# Patient Record
Sex: Male | Born: 1965 | Race: White | Hispanic: No | Marital: Married | Smoking: Never smoker
Health system: Southern US, Community
[De-identification: ages and names within clinical notes are randomized; demographics above are authoritative.]

## PROBLEM LIST (undated history)

## (undated) DIAGNOSIS — J45909 Unspecified asthma, uncomplicated: Secondary | ICD-10-CM

## (undated) HISTORY — DX: Unspecified asthma, uncomplicated: J45.909

## (undated) HISTORY — PX: TONSILLECTOMY: SUR1361

---

## 2001-12-31 ENCOUNTER — Encounter: Payer: Self-pay | Admitting: Family Medicine

## 2001-12-31 ENCOUNTER — Encounter: Admission: RE | Admit: 2001-12-31 | Discharge: 2001-12-31 | Payer: Self-pay | Admitting: Family Medicine

## 2003-12-16 ENCOUNTER — Encounter: Admission: RE | Admit: 2003-12-16 | Discharge: 2003-12-16 | Payer: Self-pay | Admitting: Family Medicine

## 2003-12-29 ENCOUNTER — Encounter: Admission: RE | Admit: 2003-12-29 | Discharge: 2003-12-29 | Payer: Self-pay | Admitting: Family Medicine

## 2005-10-07 IMAGING — CR DG CHEST 2V
2 series · 2 of 2 positions shown · non-contrast
Comparison: none

CLINICAL DATA: Right chest pain and heart palpitations. 
 TWO VIEW CHEST: 
 Two view chest with no priors for comparison. 
 Level of inspiration is suboptimal.  Heart and lungs within normal limits considering.  Osseous structures intact with a mild upper thoracic/lower cervical scoliosis to the left.

[view not recorded (1 of 2)]
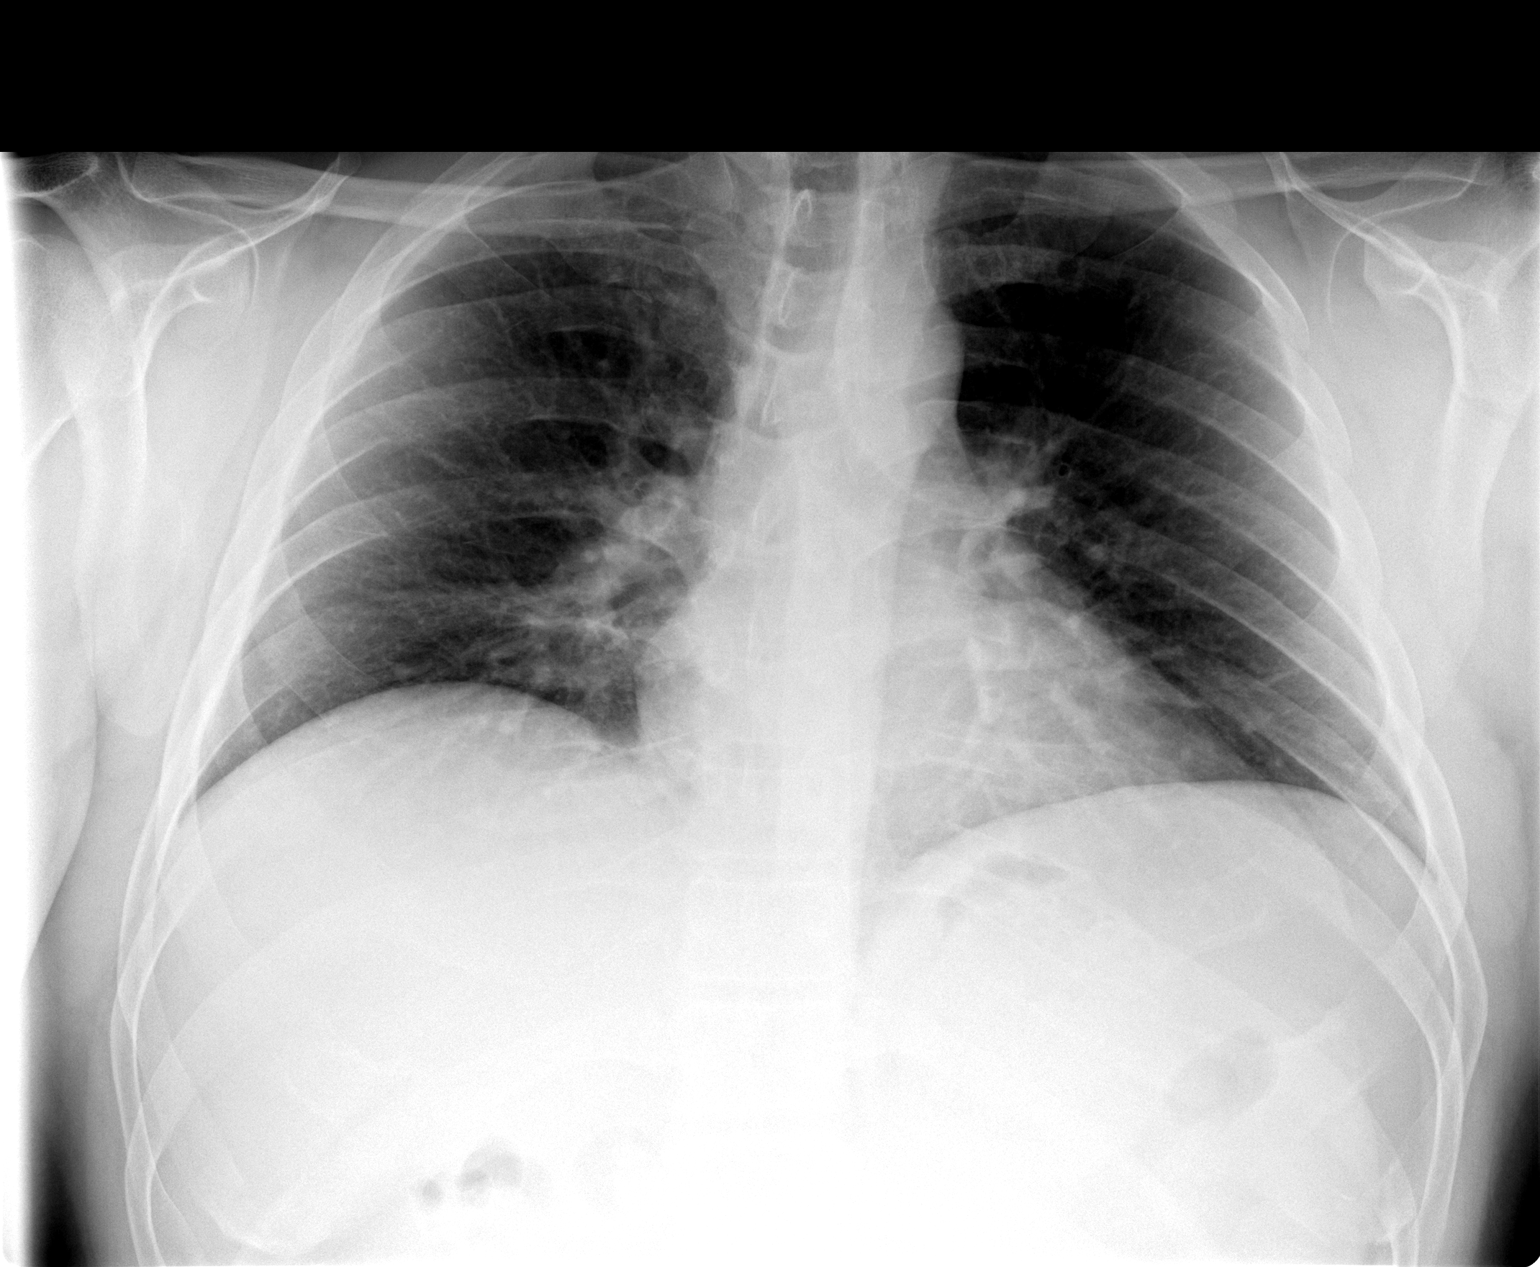

[view not recorded (2 of 2)]
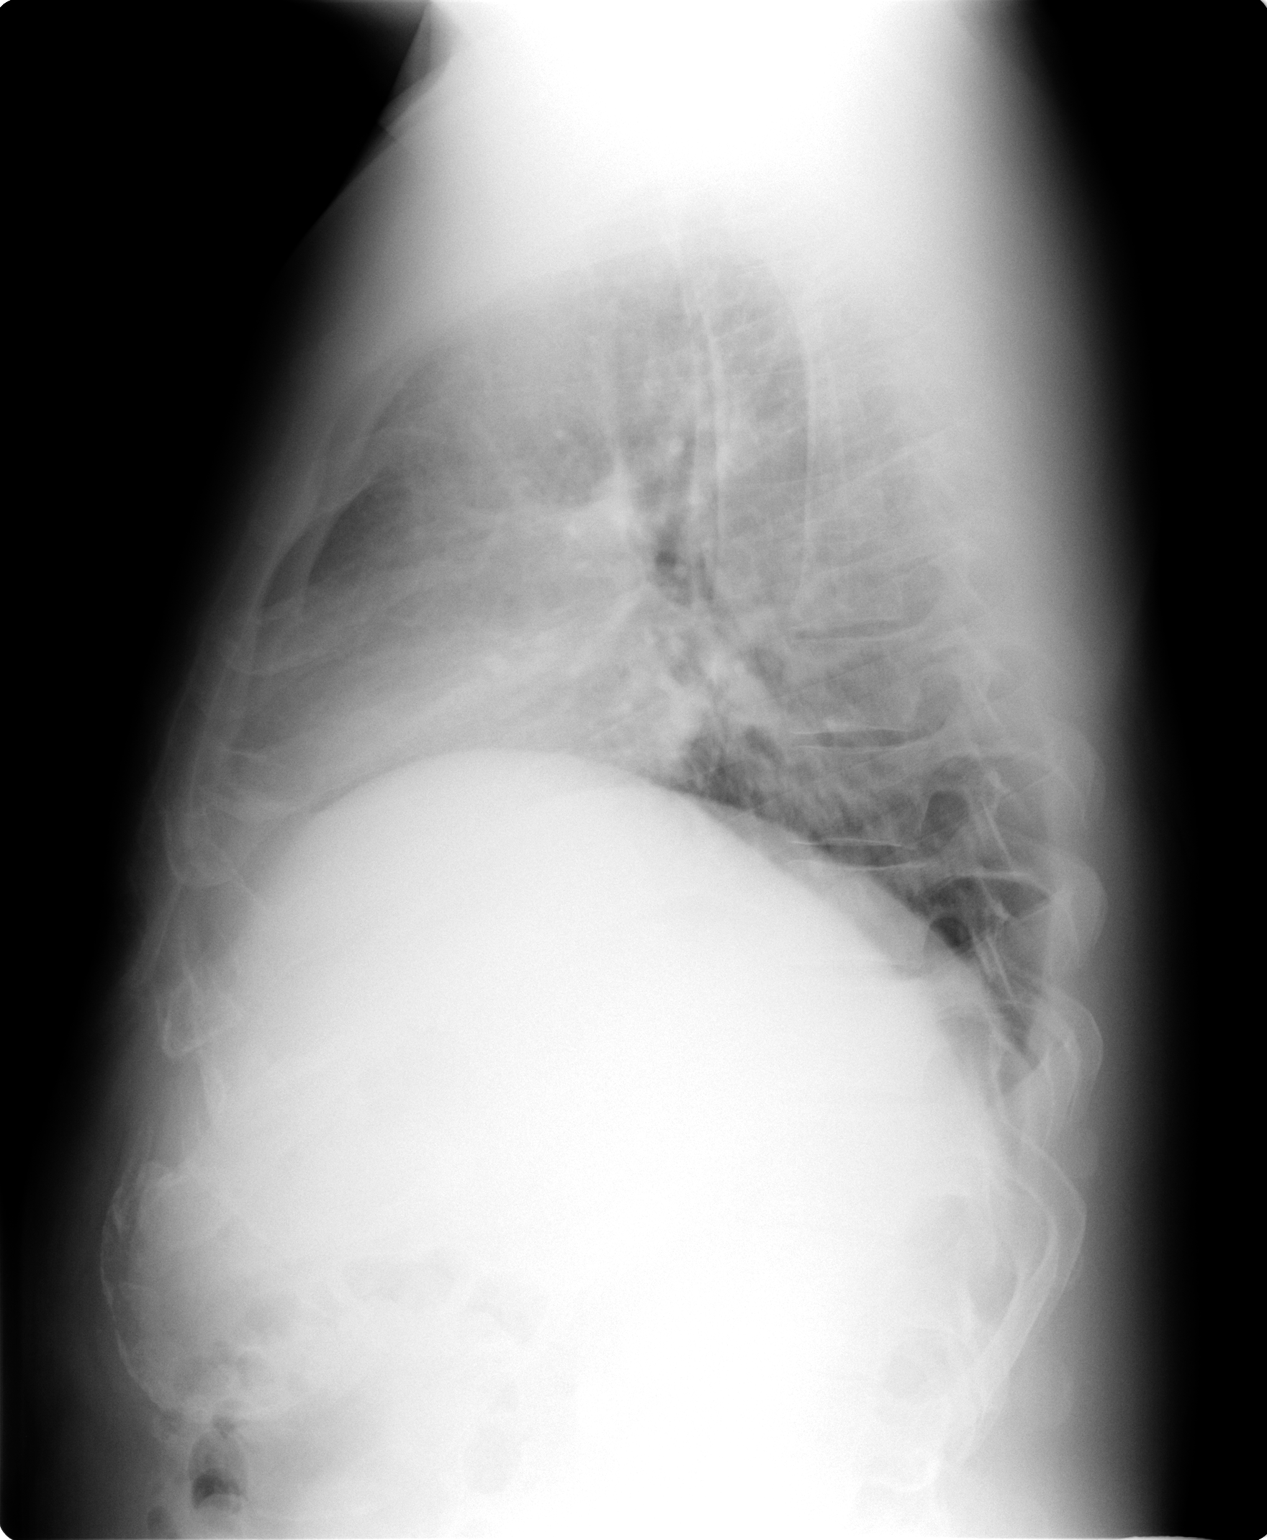

[2 of 2 positions shown; findings below may reference images not displayed]

IMPRESSION: Suboptimal level of inspiration ? no active disease.

## 2005-10-20 IMAGING — US US ABDOMEN COMPLETE
1 series · 14 of 25 positions shown · non-contrast
Comparison: none

CLINICAL DATA: Elevated liver function tests.  
 ABDOMINAL ULTRASOUND COMPLETE
 There is no evidence of gallstones or gallbladder wall thickening.  There is no evidence of biliary ductal dilatation with the common bile duct measuring approximately 3 mm.  The liver has diffusely increased parenchymal echogenicity, consistent  with diffuse fatty infiltration.  No focal liver lesions are identified.  
 The visualized portion of the IVC and pancreas are unremarkable in appearance.  The spleen is within normal limits in size measuring approximately 11 to 12 cm in length.  Both kidneys are normal in size and appearance and there is no evidence of hydronephrosis.  There is no evidence of abdominal aortic aneurysm and no ascites is visualized. 
 IMPRESSION
 1.  Diffuse fatty infiltration of the liver.  
 2.  No evidence of gallstones or biliary dilatation.

[Series 1: unknown · 14 of 62 slices shown]
[im 1/62]
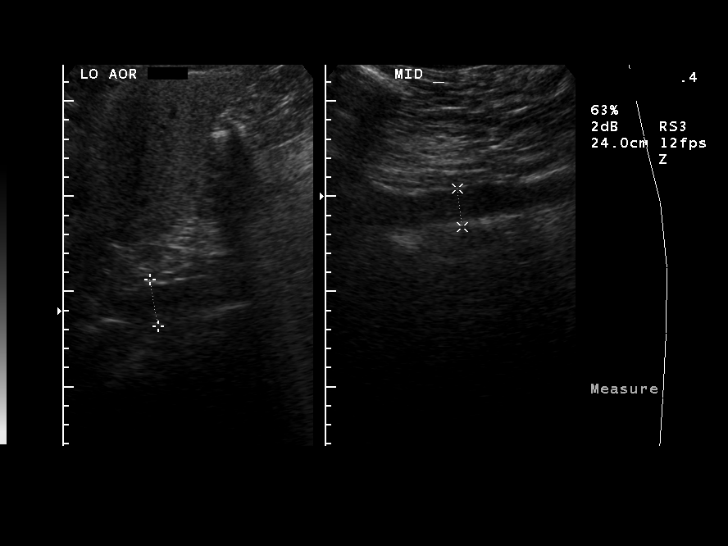
[im 6/62]
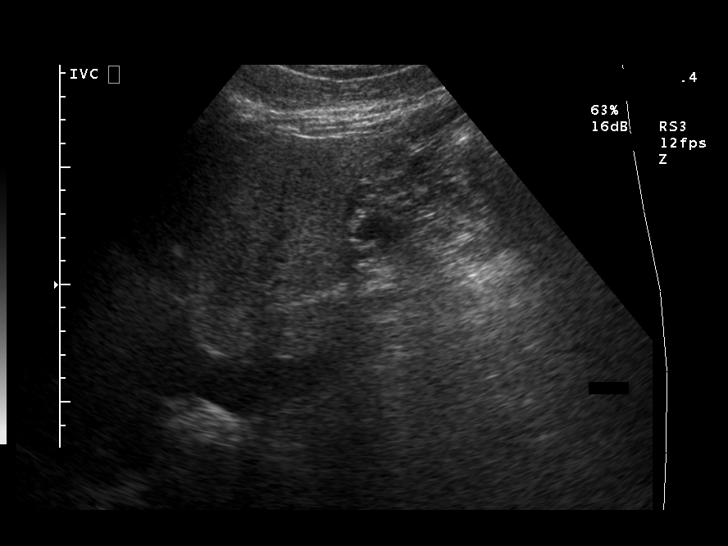
[im 11/62]
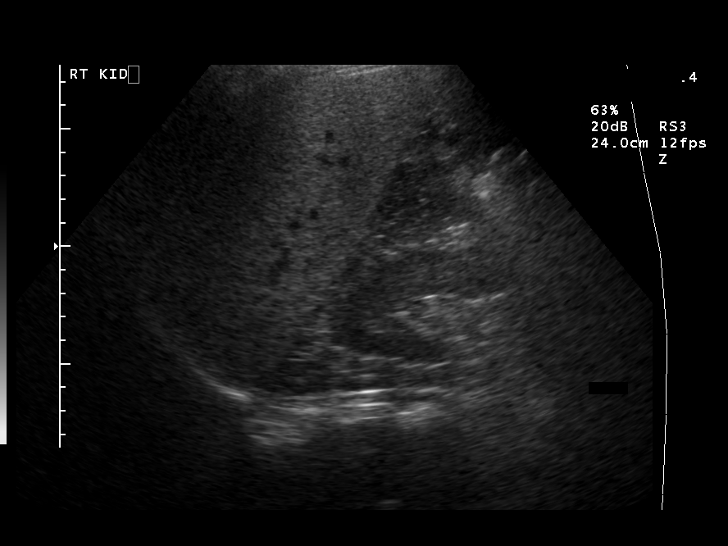
[im 16/62]
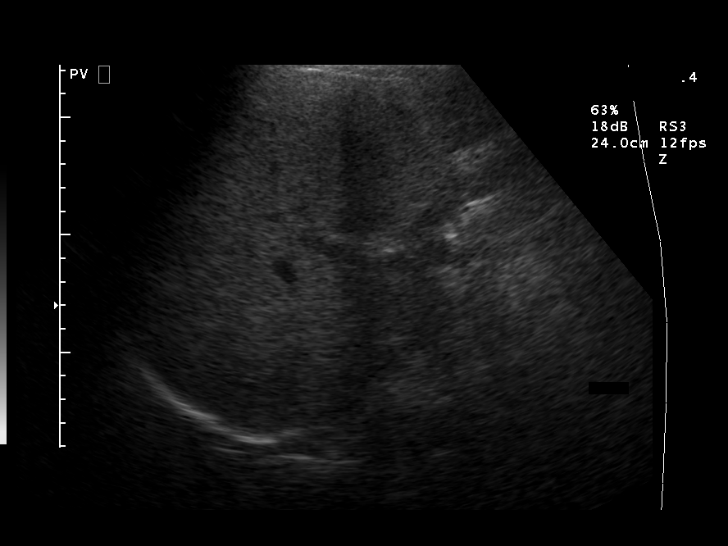
[im 21/62]
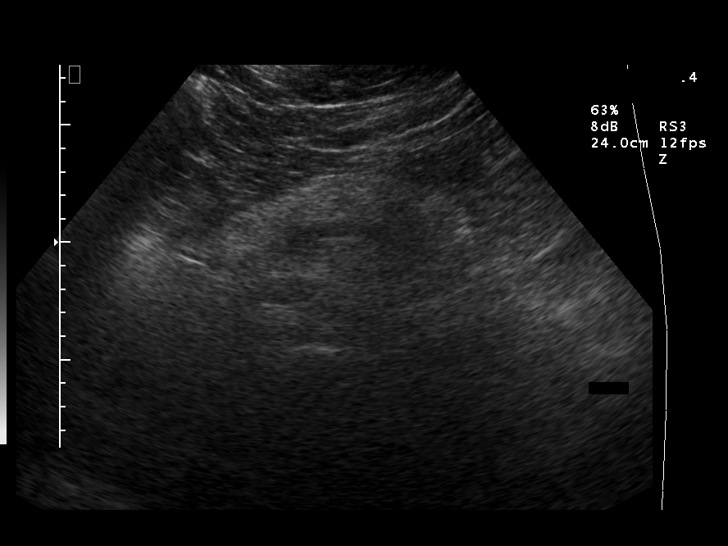
[im 23/62]
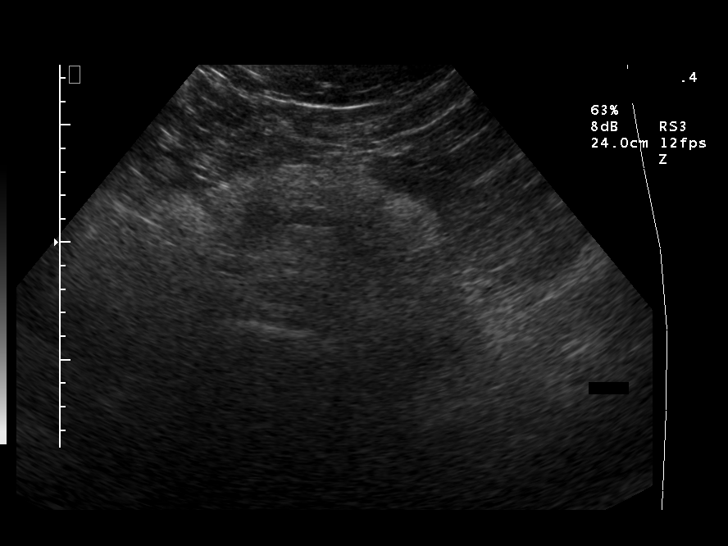
[im 28/62]
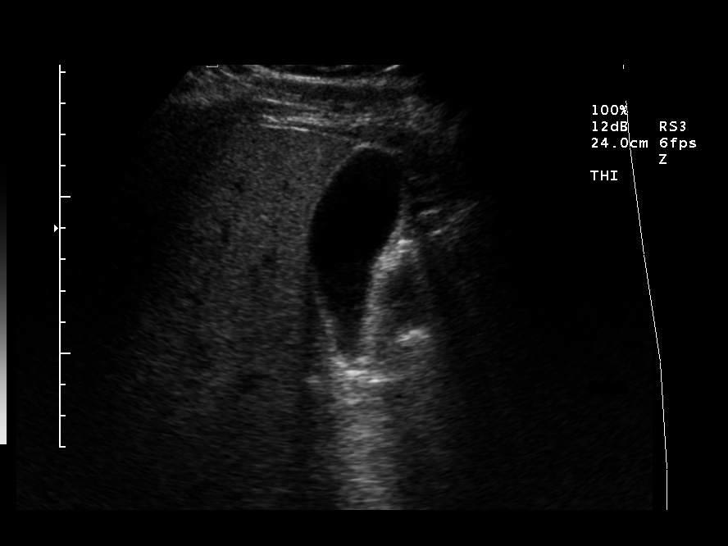
[im 34/62]
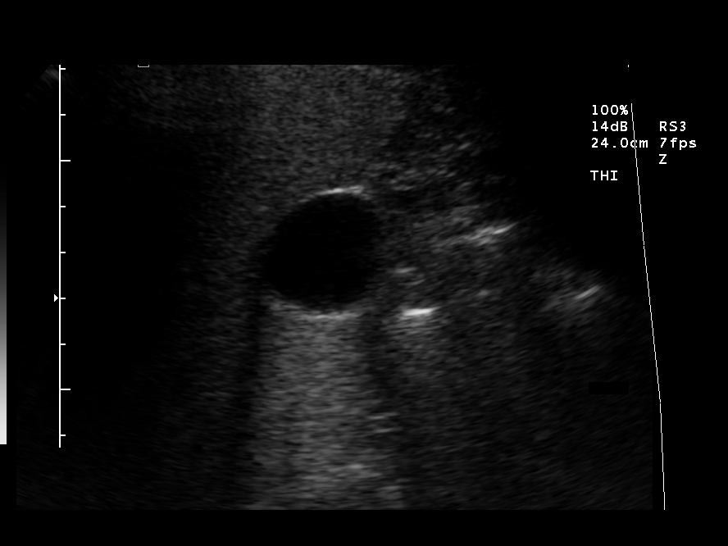
[im 39/62]
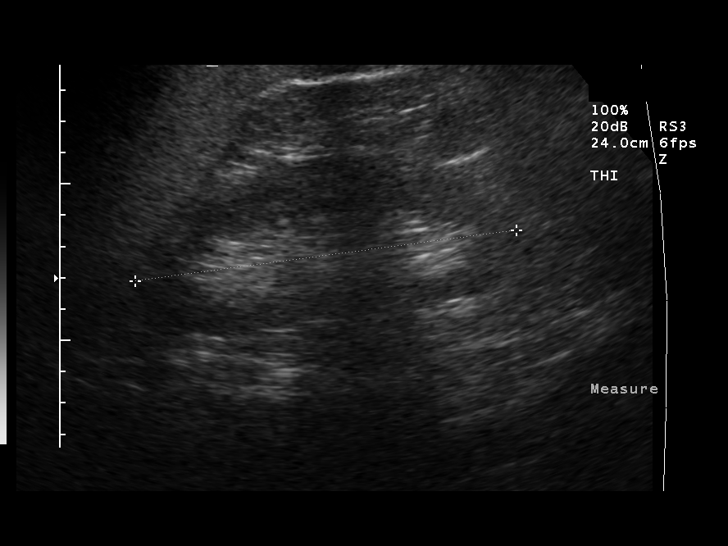
[im 41/62]
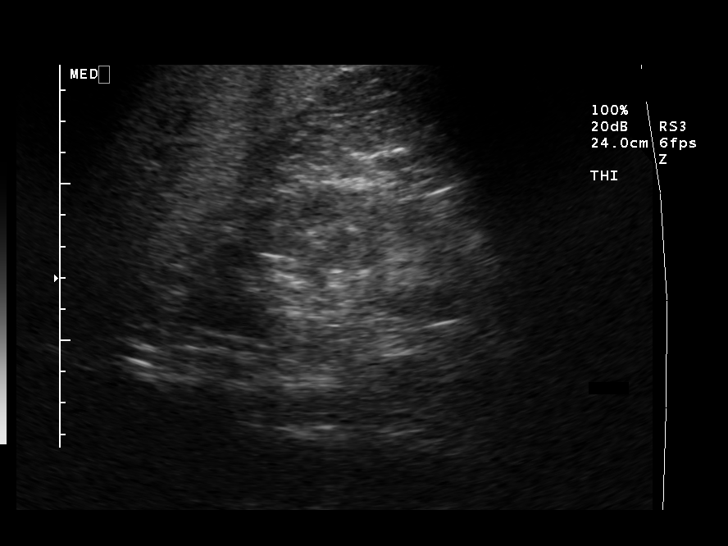
[im 46/62]
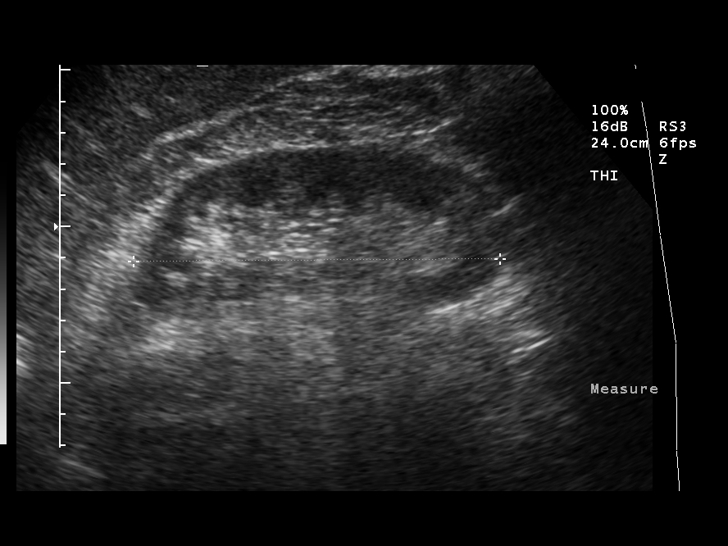
[im 51/62]
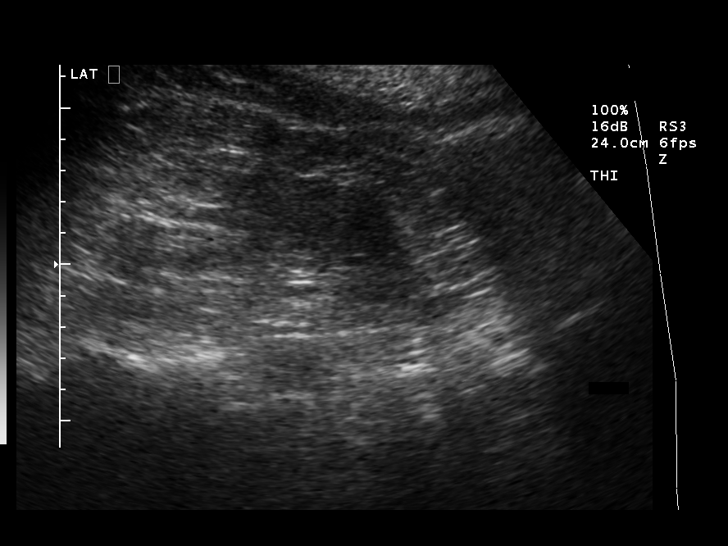
[im 56/62]
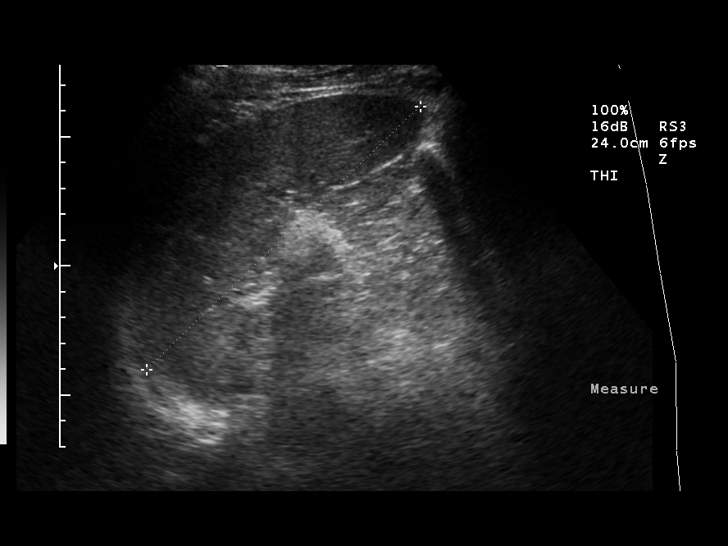
[im 62/62]
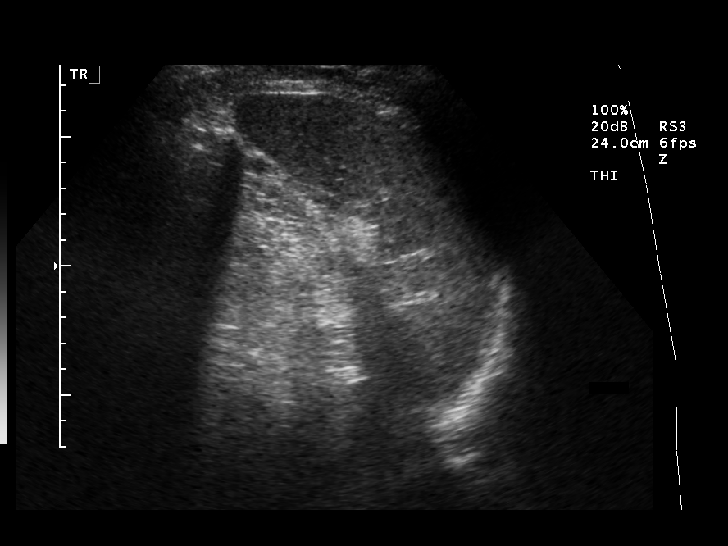

[14 of 25 positions shown; findings below may reference images not displayed]

## 2012-03-01 ENCOUNTER — Emergency Department (HOSPITAL_COMMUNITY)
Admission: EM | Admit: 2012-03-01 | Discharge: 2012-03-01 | Discharge status: Against Medical Advice | Payer: BC Managed Care – PPO | Source: Home / Self Care

## 2012-03-01 NOTE — ED Notes (Signed)
Call pt x 3 in lobby no answer. Registration did not know where he went

## 2019-07-18 ENCOUNTER — Ambulatory Visit: Payer: Self-pay | Attending: Internal Medicine

## 2019-07-18 DIAGNOSIS — Z23 Encounter for immunization: Secondary | ICD-10-CM | POA: Insufficient documentation

## 2019-07-18 NOTE — Progress Notes (Signed)
   Covid-19 Vaccination Clinic  Name:  Eugene Gould    MRN: [id number] DOB: [birth year]  07/18/2019  [name] was observed post Covid-19 immunization for 15 minutes without incident. He was provided with Vaccine Information Sheet and instruction to access the V-Safe system.   [name] was instructed to call 911 with any severe reactions post vaccine: Marland Kitchen Difficulty breathing  . Swelling of face and throat  . A fast heartbeat  . A bad rash all over body  . Dizziness and weakness   Immunizations Administered    Name Date Dose VIS Date Route   Pfizer COVID-19 Vaccine 07/18/2019  9:49 AM 0.3 mL 04/26/2019 Intramuscular   Manufacturer: [place]   Lot: [id number]   [place]: [id number]

## 2019-08-13 ENCOUNTER — Ambulatory Visit: Payer: Self-pay | Attending: Internal Medicine

## 2019-08-13 DIAGNOSIS — Z23 Encounter for immunization: Secondary | ICD-10-CM

## 2019-08-13 NOTE — Progress Notes (Signed)
   Covid-19 Vaccination Clinic  Name:  Eugene Gould    MRN: [id number] DOB: 06/08/1965  08/13/2019  [name] was observed post Covid-19 immunization for 15 minutes without incident. He was provided with Vaccine Information Sheet and instruction to access the V-Safe system.   [name] was instructed to call 911 with any severe reactions post vaccine: Marland Kitchen Difficulty breathing  . Swelling of face and throat  . A fast heartbeat  . A bad rash all over body  . Dizziness and weakness   Immunizations Administered    Name Date Dose VIS Date Route   Pfizer COVID-19 Vaccine 08/13/2019  3:54 PM 0.3 mL 04/26/2019 Intramuscular   Manufacturer: [place]   Lot: [id number]   [place]: [id number]

## 2020-02-19 ENCOUNTER — Other Ambulatory Visit: Payer: Self-pay

## 2020-02-19 DIAGNOSIS — Z20822 Contact with and (suspected) exposure to covid-19: Secondary | ICD-10-CM

## 2020-02-20 LAB — SARS-COV-2, NAA 2 DAY TAT

## 2020-02-20 LAB — NOVEL CORONAVIRUS, NAA: SARS-CoV-2, NAA: NOT DETECTED

## 2020-07-29 ENCOUNTER — Encounter (HOSPITAL_BASED_OUTPATIENT_CLINIC_OR_DEPARTMENT_OTHER): Payer: Self-pay | Admitting: Family Medicine

## 2020-07-29 ENCOUNTER — Ambulatory Visit (INDEPENDENT_AMBULATORY_CARE_PROVIDER_SITE_OTHER): Payer: Managed Care, Other (non HMO) | Admitting: Family Medicine

## 2020-07-29 ENCOUNTER — Other Ambulatory Visit: Payer: Self-pay

## 2020-07-29 ENCOUNTER — Other Ambulatory Visit (HOSPITAL_BASED_OUTPATIENT_CLINIC_OR_DEPARTMENT_OTHER): Payer: Self-pay

## 2020-07-29 ENCOUNTER — Other Ambulatory Visit (HOSPITAL_BASED_OUTPATIENT_CLINIC_OR_DEPARTMENT_OTHER)
Admission: RE | Admit: 2020-07-29 | Discharge: 2020-07-29 | Disposition: A | Discharge status: Home / Self Care | Payer: Managed Care, Other (non HMO) | Source: Ambulatory Visit | Attending: Nurse Practitioner | Admitting: Nurse Practitioner

## 2020-07-29 VITALS — BP 152/92 | HR 62 | Ht 72.0 in | Wt 261.0 lb

## 2020-07-29 DIAGNOSIS — R030 Elevated blood-pressure reading, without diagnosis of hypertension: Secondary | ICD-10-CM | POA: Insufficient documentation

## 2020-07-29 DIAGNOSIS — Z0000 Encounter for general adult medical examination without abnormal findings: Secondary | ICD-10-CM | POA: Insufficient documentation

## 2020-07-29 DIAGNOSIS — Z0289 Encounter for other administrative examinations: Secondary | ICD-10-CM | POA: Insufficient documentation

## 2020-07-29 DIAGNOSIS — Z6835 Body mass index (BMI) 35.0-35.9, adult: Secondary | ICD-10-CM | POA: Diagnosis not present

## 2020-07-29 DIAGNOSIS — Z1211 Encounter for screening for malignant neoplasm of colon: Secondary | ICD-10-CM

## 2020-07-29 LAB — BASIC METABOLIC PANEL
Anion gap: 7 (ref 5–15)
BUN: 13 mg/dL (ref 6–20)
CO2: 27 mmol/L (ref 22–32)
Calcium: 8.9 mg/dL (ref 8.9–10.3)
Chloride: 104 mmol/L (ref 98–111)
Creatinine, Ser: 0.97 mg/dL (ref 0.61–1.24)
GFR, Estimated: >60 mL/min (ref >60)
Glucose, Bld: 90 mg/dL (ref 70–99)
Potassium: 4.5 mmol/L (ref 3.5–5.1)
Sodium: 138 mmol/L (ref 135–145)

## 2020-07-29 LAB — CBC WITH DIFFERENTIAL/PLATELET
Abs Immature Granulocytes: 0.01 10*3/uL (ref 0.00–0.07)
Basophils Absolute: 0 10*3/uL (ref 0.0–0.1)
Basophils Relative: 1 %
Eosinophils Absolute: 0.1 10*3/uL (ref 0.0–0.5)
Eosinophils Relative: 1 %
HCT: 44.3 % (ref 39.0–52.0)
Hemoglobin: 15.1 g/dL (ref 13.0–17.0)
Immature Granulocytes: 0 %
Lymphocytes Relative: 36 %
Lymphs Abs: 2.7 10*3/uL (ref 0.7–4.0)
MCH: 29.8 pg (ref 26.0–34.0)
MCHC: 34.1 g/dL (ref 30.0–36.0)
MCV: 87.4 fL (ref 80.0–100.0)
Monocytes Absolute: 0.5 10*3/uL (ref 0.1–1.0)
Monocytes Relative: 6 %
Neutro Abs: 4.2 10*3/uL (ref 1.7–7.7)
Neutrophils Relative %: 56 %
Platelets: 284 10*3/uL (ref 150–400)
RBC: 5.07 MIL/uL (ref 4.22–5.81)
RDW: 13 % (ref 11.5–15.5)
WBC: 7.4 10*3/uL (ref 4.0–10.5)
nRBC: 0 % (ref 0.0–0.2)

## 2020-07-29 LAB — LIPID PANEL
Cholesterol: 226 mg/dL — ABNORMAL HIGH (ref 0–200)
HDL: 39 mg/dL — ABNORMAL LOW (ref >40)
LDL Cholesterol: 169 mg/dL — ABNORMAL HIGH (ref 0–99)
Total CHOL/HDL Ratio: 5.8 RATIO
Triglycerides: 89 mg/dL (ref <150)
VLDL: 18 mg/dL (ref 0–40)

## 2020-07-29 NOTE — Progress Notes (Signed)
New Patient Office Visit  Subjective:  Patient ID: Eugene Gould, male    DOB: May 31, 1965  Age: 55 y.o. MRN: [id number]  CC:  Chief Complaint  Patient presents with  . Establish Care  . Employment Physical    HPI Eugene Gould is a 55 year old male presenting to establish in clinic and to complete physical exam for work.  Denies any significant past medical history.  Does have notable family history, particular related to hypertension, cardiac disease.  History reviewed. No pertinent past medical history.  History reviewed. No pertinent surgical history.  Family History  Problem Relation Age of Onset  . Anuerysm Mother   . Heart disease Father   . Hypertension Father   . Gout Brother   . Alzheimer's disease Maternal Grandmother   . Cancer Paternal Grandmother   . COPD Paternal Grandfather     Social History   Socioeconomic History  . Marital status: Married    Spouse name: Not on file  . Number of children: Not on file  . Years of education: Not on file  . Highest education level: Not on file  Occupational History    Employer: [employer]  Tobacco Use  . Smoking status: Never Smoker  . Smokeless tobacco: Never Used  Vaping Use  . Vaping Use: Never used  Substance and Sexual Activity  . Alcohol use: Not Currently  . Drug use: Never  . Sexual activity: Yes  Other Topics Concern  . Not on file  Social History Narrative  . Not on file   Social Determinants of Health   Financial Resource Strain: Not on file  Food Insecurity: Not on file  Transportation Needs: Not on file  Physical Activity: Not on file  Stress: Not on file  Social Connections: Not on file  Intimate Partner Violence: Not on file    ROS Review of Systems  Constitutional: Negative for chills, fatigue and fever.  HENT: Negative for dental problem, ear pain and sore throat.   Eyes: Negative for photophobia and visual disturbance.  Respiratory: Negative for cough and shortness of breath.    Cardiovascular: Negative for chest pain and palpitations.  Gastrointestinal: Negative for abdominal pain, constipation, diarrhea, nausea and vomiting.  Endocrine: Negative for polydipsia and polyuria.  Genitourinary: Negative for dysuria and urgency.  Musculoskeletal: Negative for arthralgias and neck pain.  Skin: Negative for color change and rash.  Neurological: Negative for dizziness, weakness and light-headedness.  Hematological: Does not bruise/bleed easily.  Psychiatric/Behavioral: Negative for decreased concentration and sleep disturbance. The patient is not nervous/anxious.     Objective:   Today's Vitals: BP (!) 152/92   Pulse 62   Ht 6' (1.829 m)   Wt 261 lb (118.4 kg)   SpO2 99%   BMI 35.40 kg/m   Physical Exam Constitutional:      General: He is not in acute distress.    Appearance: He is obese.  HENT:     Head: Normocephalic and atraumatic.     Right Ear: Tympanic membrane, ear canal and external ear normal.     Left Ear: Tympanic membrane, ear canal and external ear normal.     Nose: Nose normal.     Mouth/Throat:     Mouth: Mucous membranes are moist.     Pharynx: Oropharynx is clear.  Eyes:     Extraocular Movements: Extraocular movements intact.     Conjunctiva/sclera: Conjunctivae normal.     Pupils: Pupils are equal, round, and reactive to light.  Cardiovascular:     Rate and Rhythm: Normal rate and regular rhythm.     Pulses: Normal pulses.     Heart sounds: Normal heart sounds. No murmur heard.   Pulmonary:     Effort: Pulmonary effort is normal.     Breath sounds: Normal breath sounds. No wheezing.  Abdominal:     General: Bowel sounds are normal. There is no distension.     Palpations: Abdomen is soft.     Tenderness: There is no abdominal tenderness. There is no guarding.  Musculoskeletal:     Cervical back: Normal range of motion. No tenderness.  Skin:    General: Skin is warm.     Capillary Refill: Capillary refill takes less than 2  seconds.     Coloration: Skin is not jaundiced.     Findings: No rash.  Neurological:     General: No focal deficit present.     Mental Status: He is alert and oriented to person, place, and time.     Gait: Gait normal.  Psychiatric:        Mood and Affect: Mood normal.        Behavior: Behavior normal.     Assessment & Plan:   Problem List Items Addressed This Visit      Other   Elevated BP without diagnosis of hypertension    Recommend interval blood pressure checks at home with home blood pressure cuff Provided with handout to log blood pressure checks Discussed lifestyle modifications as above      Relevant Orders   Basic Metabolic Panel (BMET)   CBC with Differential/Platelet   Lipid panel   Physical exam    CPE completed today Discussed alteration of blood pressure today in the office as well as BMI. Will check labs including CBC, BMP, lipid panel We will place referral to gastroenterology for screening colonoscopy Discussed need for tetanus booster Recommend intermittent blood pressure checks at home as patient reports he has an automatic cuff Given significant family history of hypertension and cardiac disease, recommend close monitoring of blood pressure and working towards reduction of BMI to normal range Discussed lifestyle modification, benefits of exercise, specifically recommendation of 150 minutes of moderate intensity per week.  Reviewed gradual regimen of increasing weekly exercise      Relevant Orders   Basic Metabolic Panel (BMET)   CBC with Differential/Platelet   Lipid panel   BMI 35.0-35.9,adult    Discussed lifestyle modifications above We will continue to monitor at future visits Labs to be completed as above      Relevant Orders   Basic Metabolic Panel (BMET)   CBC with Differential/Platelet   Lipid panel    Other Visit Diagnoses    Encounter for medical examination to establish care    -  Primary   Encounter for physical examination  related to employment       Relevant Orders   Ambulatory referral to Gastroenterology   Basic Metabolic Panel (BMET)   CBC with Differential/Platelet   Lipid panel   Encounter for screening colonoscopy       Relevant Orders   Ambulatory referral to Gastroenterology      No outpatient encounter medications on file as of 07/29/2020.   No facility-administered encounter medications on file as of 07/29/2020.   Spent 45 minutes on this patient encounter, including preparation, chart review, face-to-face counseling with patient and coordination of care, and documentation of encounter  Follow-up: Return in about 6 months (around 01/29/2021) for  follow up - in office -- BP follow up.   Geneveive Furness J De [place], MD

## 2020-07-29 NOTE — Patient Instructions (Signed)
   Medication Instructions:  Your physician recommends that you continue on your current medications as directed. Please refer to the Current Medication list given to you today. If you need a refill on any your medications before your next appointment, please call your pharmacy first.   Lab Work: Your physician has recommended that you have lab work today: BMET, CBC, and Lipid Panel  If you have labs (blood work) drawn today and your tests are completely normal, you will receive your results only by: Marland Kitchen MyChart Message (if you have MyChart) OR . A phone call from our staff. Please ensure you check your voicemail in the event that you authorized detailed messages to be left on a delegated number. If you have any lab test that is abnormal or we need to change your treatment, we will call you to review the results.  Procedures/Imaging: Your physician recommends that you have a colonoscopy. A colonoscopy is a test that allows a healthcare provider to see inside your large intestine. This procedure is done with a flexible camera called a scope. This test is used to check out symptoms like bleeding, as well as look for polyps and possible signs of colon cancer. It's recommended that adults start getting colonoscopies at age 25. A referral has been placed and you will be contacted by a Gastroenterologist to arrange this procedure.  Follow-Up: Your next appointment:   Your physician recommends that you schedule a follow-up appointment in: 6 MONTHS with Dr. [place]  We recommend signing up for the patient portal called "MyChart".  Sign up information is provided on this After Visit Summary.  MyChart is used to connect with patients for Virtual Visits (Telemedicine).  Patients are able to view lab/test results, encounter notes, upcoming appointments, etc.  Non-urgent messages can be sent to your provider as well.   To learn more about what you can do with MyChart, go to [email or web address].    Thanks  for letting us be apart of your health journey!!  Primary Care and Sports Medicine    Dr. de [place] and Worthy Keeler, DNP, AGNP

## 2020-07-29 NOTE — Assessment & Plan Note (Signed)
CPE completed today Discussed alteration of blood pressure today in the office as well as BMI. Will check labs including CBC, BMP, lipid panel We will place referral to gastroenterology for screening colonoscopy Discussed need for tetanus booster Recommend intermittent blood pressure checks at home as patient reports he has an automatic cuff Given significant family history of hypertension and cardiac disease, recommend close monitoring of blood pressure and working towards reduction of BMI to normal range Discussed lifestyle modification, benefits of exercise, specifically recommendation of 150 minutes of moderate intensity per week.  Reviewed gradual regimen of increasing weekly exercise

## 2020-07-29 NOTE — Assessment & Plan Note (Signed)
Recommend interval blood pressure checks at home with home blood pressure cuff Provided with handout to log blood pressure checks Discussed lifestyle modifications as above

## 2020-07-29 NOTE — Assessment & Plan Note (Signed)
Discussed lifestyle modifications above We will continue to monitor at future visits Labs to be completed as above

## 2020-07-30 ENCOUNTER — Telehealth (HOSPITAL_BASED_OUTPATIENT_CLINIC_OR_DEPARTMENT_OTHER): Payer: Self-pay

## 2020-07-30 NOTE — Telephone Encounter (Signed)
-----   Message from [name], MD sent at 07/30/2020  1:30 PM EDT ----- Electrolytes and kidney function normal.  No signs of anemia.  Lipid panel with elevated total cholesterol, elevated "bad" cholesterol with low "good" cholesterol.  Would initially focus on lifestyle modifications to address cholesterol issues.  This will include dietary changes - incorporating fresh fruits and vegetables, lean protein in the diet and reducing consumption of red meats, saturated fats, processed foods.  Recommend gradually increasing level of activity as discussed in the office.  Eventual goal should be started about 150 minutes/week of moderate intensity aerobic exercise.

## 2020-07-30 NOTE — Telephone Encounter (Signed)
Results released by Dr. de [place] and reviewed by patient via MyChart Instructed patient to contact the office with any questions or concerns.  

## 2020-10-01 ENCOUNTER — Ambulatory Visit (AMBULATORY_SURGERY_CENTER): Payer: Self-pay | Admitting: *Deleted

## 2020-10-01 ENCOUNTER — Other Ambulatory Visit: Payer: Self-pay

## 2020-10-01 VITALS — Ht 72.0 in | Wt 263.0 lb

## 2020-10-01 DIAGNOSIS — Z1211 Encounter for screening for malignant neoplasm of colon: Secondary | ICD-10-CM

## 2020-10-01 MED ORDER — PEG 3350-KCL-NA BICARB-NACL 420 G PO SOLR: 4000.0000 mL | Freq: Once | ORAL | 0 refills | Status: AC

## 2020-10-01 NOTE — Progress Notes (Signed)

## 2020-10-05 ENCOUNTER — Encounter: Payer: Self-pay | Admitting: Gastroenterology

## 2020-10-14 ENCOUNTER — Telehealth: Payer: Self-pay | Admitting: Gastroenterology

## 2020-10-14 DIAGNOSIS — Z1211 Encounter for screening for malignant neoplasm of colon: Secondary | ICD-10-CM

## 2020-10-14 HISTORY — PX: COLONOSCOPY: SHX174

## 2020-10-14 MED ORDER — SUPREP BOWEL PREP KIT 17.5-3.13-1.6 GM/177ML PO SOLN: 1.0000 | Freq: Once | ORAL | 0 refills | Status: AC

## 2020-10-14 NOTE — Telephone Encounter (Signed)
Pt is calling, requesting Suprep instead of Golytely.  I did make him aware that Hamel can much more expensive and he states he is ok with that and requests Suprep.  New instructions sent via [place] sent to pharmacy.

## 2020-10-15 ENCOUNTER — Encounter: Payer: Self-pay | Admitting: Gastroenterology

## 2020-10-15 ENCOUNTER — Other Ambulatory Visit: Payer: Self-pay

## 2020-10-15 ENCOUNTER — Ambulatory Visit (AMBULATORY_SURGERY_CENTER): Payer: Managed Care, Other (non HMO) | Admitting: Gastroenterology

## 2020-10-15 VITALS — BP 124/82 | HR 68 | Temp 98.4°F | Resp 12 | Ht 72.0 in | Wt 263.0 lb

## 2020-10-15 DIAGNOSIS — K635 Polyp of colon: Secondary | ICD-10-CM

## 2020-10-15 DIAGNOSIS — Z1211 Encounter for screening for malignant neoplasm of colon: Secondary | ICD-10-CM

## 2020-10-15 DIAGNOSIS — D128 Benign neoplasm of rectum: Secondary | ICD-10-CM

## 2020-10-15 DIAGNOSIS — K621 Rectal polyp: Secondary | ICD-10-CM

## 2020-10-15 DIAGNOSIS — D124 Benign neoplasm of descending colon: Secondary | ICD-10-CM

## 2020-10-15 MED ORDER — SODIUM CHLORIDE 0.9 % IV SOLN: 500.0000 mL | Freq: Once | INTRAVENOUS | Status: DC

## 2020-10-15 NOTE — Progress Notes (Signed)
Report to PACU, RN, vss, BBS= Clear.  

## 2020-10-15 NOTE — Patient Instructions (Signed)
YOU HAD AN ENDOSCOPIC PROCEDURE TODAY AT [facility]:   Refer to the procedure report that was given to you for any specific questions about what was found during the examination.  If the procedure report does not answer your questions, please call your gastroenterologist to clarify.  If you requested that your care partner not be given the details of your procedure findings, then the procedure report has been included in a sealed envelope for you to review at your convenience later.  YOU SHOULD EXPECT: Some feelings of bloating in the abdomen. Passage of more gas than usual.  Walking can help get rid of the air that was put into your GI tract during the procedure and reduce the bloating. If you had a lower endoscopy (such as a colonoscopy or flexible sigmoidoscopy) you may notice spotting of blood in your stool or on the toilet paper. If you underwent a bowel prep for your procedure, you may not have a normal bowel movement for a few days.  Please Note:  You might notice some irritation and congestion in your nose or some drainage.  This is from the oxygen used during your procedure.  There is no need for concern and it should clear up in a day or so.  SYMPTOMS TO REPORT IMMEDIATELY:   Following lower endoscopy (colonoscopy or flexible sigmoidoscopy):  Excessive amounts of blood in the stool  Significant tenderness or worsening of abdominal pains  Swelling of the abdomen that is new, acute  Fever of 100F or higher   For urgent or emergent issues, a gastroenterologist can be reached at any hour by calling [phone]. Do not use MyChart messaging for urgent concerns.    DIET:  We do recommend a small meal at first, but then you may proceed to your regular diet.  Drink plenty of fluids but you should avoid alcoholic beverages for 24 hours. Follow a High Fiber Diet (see handout).  MEDICATIONS: Continue present medications. Use FiberCon 1-2 tablets by mouth daily.  Please  see handouts given to you by your recovery nurse.  Thank you for allowing [place] to provide for your healthcare needs today.  ACTIVITY:  You should plan to take it easy for the rest of today and you should NOT DRIVE or use heavy machinery until tomorrow (because of the sedation medicines used during the test).    FOLLOW UP: Our staff will call the number listed on your records 48-72 hours following your procedure to check on you and address any questions or concerns that you may have regarding the information given to you following your procedure. If we do not reach you, we will leave a message.  We will attempt to reach you two times.  During this call, we will ask if you have developed any symptoms of COVID 19. If you develop any symptoms (ie: fever, flu-like symptoms, shortness of breath, cough etc.) before then, please call [phone].  If you test positive for Covid 19 in the 2 weeks post procedure, please call and report this information to [place].    If any biopsies were taken you will be contacted by phone or by letter within the next 1-3 weeks.  Please call us at [phone] if you have not heard about the biopsies in 3 weeks.    SIGNATURES/CONFIDENTIALITY: You and/or your care partner have signed paperwork which will be entered into your electronic medical record.  These signatures attest to the fact that that the information above on your  After Visit Summary has been reviewed and is understood.  Full responsibility of the confidentiality of this discharge information lies with you and/or your care-partner.

## 2020-10-15 NOTE — Progress Notes (Signed)
Called to room to assist during endoscopic procedure.  Patient ID and intended procedure confirmed with present staff. Received instructions for my participation in the procedure from the performing physician.  

## 2020-10-15 NOTE — Progress Notes (Signed)
VS by CW  Pt's states no medical or surgical changes since previsit or office visit.  

## 2020-10-15 NOTE — Op Note (Signed)
[place] Patient Name: Eugene Gould Procedure Date: 10/15/2020 3:34 PM MRN: [id number] Endoscopist: Justice Britain , MD Age: 55 Referring MD:  Date of Birth: 1965/10/17 Gender: Male Account #: [id number] Procedure:                Colonoscopy Indications:              Screening for colorectal malignant neoplasm, This                            is the patient's first colonoscopy Medicines:                Monitored Anesthesia Care Procedure:                Pre-Anesthesia Assessment:                           - Prior to the procedure, a History and Physical                            was performed, and patient medications and                            allergies were reviewed. The patient's tolerance of                            previous anesthesia was also reviewed. The risks                            and benefits of the procedure and the sedation                            options and risks were discussed with the patient.                            All questions were answered, and informed consent                            was obtained. Prior Anticoagulants: The patient has                            taken no previous anticoagulant or antiplatelet                            agents. ASA Grade Assessment: II - A patient with                            mild systemic disease. After reviewing the risks                            and benefits, the patient was deemed in                            satisfactory condition to undergo the procedure.  After obtaining informed consent, the colonoscope                            was passed under direct vision. Throughout the                            procedure, the patient's blood pressure, pulse, and                            oxygen saturations were monitored continuously. The                            Olympus CF-HQ190 [id number]) Colonoscope was                            introduced through the anus and  advanced to the 5                            cm into the ileum. The colonoscopy was performed                            without difficulty. The patient tolerated the                            procedure. The quality of the bowel preparation was                            good. The terminal ileum, ileocecal valve,                            appendiceal orifice, and rectum were photographed. Scope In: 3:48:50 PM Scope Out: 4:03:14 PM Scope Withdrawal Time: 0 hours 12 minutes 29 seconds  Total Procedure Duration: 0 hours 14 minutes 24 seconds  Findings:                 Skin tags were found on perianal exam.                           The digital rectal exam findings include                            hemorrhoids. Pertinent negatives include no                            palpable rectal lesions.                           The terminal ileum and ileocecal valve appeared                            normal.                           Three sessile polyps were found in the rectum (2)  and descending colon (1). The polyps were 2 to 5 mm                            in size. These polyps were removed with a cold                            snare. Resection and retrieval were complete.                           Multiple small-mouthed diverticula were found in                            the recto-sigmoid colon and sigmoid colon.                           Normal mucosa was found in the entire colon                            otherwise.                           Non-bleeding non-thrombosed external and internal                            hemorrhoids were found during retroflexion, during                            perianal exam and during digital exam. The                            hemorrhoids were Grade II (internal hemorrhoids                            that prolapse but reduce spontaneously). Complications:            No immediate complications. Estimated Blood Loss:      Estimated blood loss was minimal. Impression:               - Perianal skin tags found on perianal exam.                           - Hemorrhoids found on digital rectal exam.                           - The examined portion of the ileum was normal.                           - Three 2 to 5 mm polyps in the rectum and in the                            descending colon, removed with a cold snare.                            Resected and retrieved.                           -  Diverticulosis in the recto-sigmoid colon and in                            the sigmoid colon.                           - Normal mucosa in the entire examined colon                            otherwise.                           - Non-bleeding non-thrombosed external and internal                            hemorrhoids. Recommendation:           - The patient will be observed post-procedure,                            until all discharge criteria are met.                           - Discharge patient to home.                           - Patient has a contact number available for                            emergencies. The signs and symptoms of potential                            delayed complications were discussed with the                            patient. Return to normal activities tomorrow.                            Written discharge instructions were provided to the                            patient.                           - High fiber diet.                           - Use FiberCon 1-2 tablets PO daily.                           - Continue present medications.                           - Await pathology results.                           - Repeat colonoscopy in 3/5/7 years for  surveillance based on pathology results and                            findings of adenomatous tissue.                           - The findings and recommendations were discussed                             with the patient.                           - The findings and recommendations were discussed                            with the patient's family. Justice Britain, MD 10/15/2020 4:08:02 PM

## 2020-10-19 ENCOUNTER — Telehealth: Payer: Self-pay

## 2020-10-19 ENCOUNTER — Telehealth: Payer: Self-pay | Admitting: *Deleted

## 2020-10-19 NOTE — Telephone Encounter (Signed)
  Follow up Call-  Call back number 10/15/2020  Post procedure Call Back phone  # [phone]  Permission to leave phone message Yes  Some recent data might be hidden     Patient questions:  Do you have a fever, pain , or abdominal swelling? No. Pain Score  0 *  Have you tolerated food without any problems? Yes.    Have you been able to return to your normal activities? Yes.    Do you have any questions about your discharge instructions: Diet   No. Medications  No. Follow up visit  No.  Do you have questions or concerns about your Care? No.  Actions: * If pain score is 4 or above: No action needed, pain <4.

## 2020-10-19 NOTE — Telephone Encounter (Signed)
  Follow up Call-  Call back number 10/15/2020  Post procedure Call Back phone  # [phone]  Permission to leave phone message Yes  Some recent data might be hidden    2nd follow up call made. NALM

## 2020-10-21 ENCOUNTER — Encounter: Payer: Self-pay | Admitting: Gastroenterology

## 2021-01-29 ENCOUNTER — Other Ambulatory Visit: Payer: Self-pay

## 2021-01-29 ENCOUNTER — Encounter (HOSPITAL_BASED_OUTPATIENT_CLINIC_OR_DEPARTMENT_OTHER): Payer: Self-pay | Admitting: Family Medicine

## 2021-01-29 ENCOUNTER — Ambulatory Visit (HOSPITAL_BASED_OUTPATIENT_CLINIC_OR_DEPARTMENT_OTHER): Payer: Managed Care, Other (non HMO) | Admitting: Family Medicine

## 2021-01-29 VITALS — BP 138/88 | HR 64 | Ht 72.0 in | Wt 266.0 lb

## 2021-01-29 DIAGNOSIS — Z6835 Body mass index (BMI) 35.0-35.9, adult: Secondary | ICD-10-CM | POA: Diagnosis not present

## 2021-01-29 DIAGNOSIS — R030 Elevated blood-pressure reading, without diagnosis of hypertension: Secondary | ICD-10-CM | POA: Diagnosis not present

## 2021-01-29 MED ORDER — OZEMPIC (0.25 OR 0.5 MG/DOSE) 2 MG/1.5ML ~~LOC~~ SOPN: PEN_INJECTOR | SUBCUTANEOUS | 0 refills | Status: DC

## 2021-01-29 NOTE — Assessment & Plan Note (Signed)
Reports that he has been checking his blood pressure at home Typical readings tend to be systolic [id number], diastolic [id number] Denies any issues with chest pain, headaches Blood pressure today is improved from prior visit, still slightly borderline Recommend continuing with lifestyle modifications including DASH diet, working towards healthy, gradual weight loss Continue to monitor blood pressure at home

## 2021-01-29 NOTE — Assessment & Plan Note (Addendum)
Weight mostly unchanged since last visit Has tried lifestyle modifications recently and in the past Patient with questions today regarding pharmacotherapy options to assist in weight loss Previously patient has been on phentermine as well as transitioned to phentermine and topiramate combination.  He was on this about 3 years ago, total duration of pharmacotherapy was for about 1 year.  Indicates that he was down to 210 pounds Discussed various pharmacologic options with patient today including oral and injectable options We will attempt to proceed with Ozempic if not cost prohibitive If cost is an issue, will attempt to start patient on phentermine 15 mg once daily Discussed side effects of both medications as above Patient understands the need to continue with lifestyle modifications regardless of any pharmacotherapy initiated Plan for follow-up in 1 month to monitor response to which ever therapy is started

## 2021-01-29 NOTE — Patient Instructions (Signed)
  Medication Instructions:  Your physician has recommended you make the following change in your medication:  -- Start Ozempic -- Inject 0.25 mg into your skin subcutaneously once weekly for 4 weeks, then inject 0.5 mg into your skin subcutaneously for once weekly 2 weeks. --If you need a refill on any your medications before your next appointment, please call your pharmacy first. If no refills are authorized on file call the office.--  Follow-Up: Your next appointment:   Your physician recommends that you schedule a follow-up appointment in: 32 WEEKS with Dr. de [place]  Thanks for letting us be apart of your health journey!!  Primary Care and Sports Medicine   Dr. Arlina Robes [place]   We encourage you to activate your patient portal called "MyChart".  Sign up information is provided on this After Visit Summary.  MyChart is used to connect with patients for Virtual Visits (Telemedicine).  Patients are able to view lab/test results, encounter notes, upcoming appointments, etc.  Non-urgent messages can be sent to your provider as well. To learn more about what you can do with MyChart, please visit --  [email or web address].

## 2021-01-29 NOTE — Progress Notes (Signed)
    Procedures performed today:    None.  Independent interpretation of notes and tests performed by another provider:   None.  Brief History, Exam, Impression, and Recommendations:    BP 138/88   Pulse 64   Ht 6' (1.829 m)   Wt 266 lb (120.7 kg)   SpO2 98%   BMI 36.08 kg/m   Elevated BP without diagnosis of hypertension Reports that he has been checking his blood pressure at home Typical readings tend to be systolic [id number], diastolic [id number] Denies any issues with chest pain, headaches Blood pressure today is improved from prior visit, still slightly borderline Recommend continuing with lifestyle modifications including DASH diet, working towards healthy, gradual weight loss Continue to monitor blood pressure at home  BMI 35.0-35.9,adult Weight mostly unchanged since last visit Has tried lifestyle modifications recently and in the past Patient with questions today regarding pharmacotherapy options to assist in weight loss Previously patient has been on phentermine as well as transitioned to phentermine and topiramate combination.  He was on this about 3 years ago, total duration of pharmacotherapy was for about 1 year.  Indicates that he was down to 210 pounds Discussed various pharmacologic options with patient today including oral and injectable options We will attempt to proceed with Ozempic if not cost prohibitive If cost is an issue, will attempt to start patient on phentermine 15 mg once daily Patient understands the need to continue with lifestyle modifications regardless of any pharmacotherapy initiated Plan for follow-up in 1 month to monitor response to which ever therapy is started  Assess desire for flu vaccine at next visit   ___________________________________________ Angus Amini de [place], MD, ABFM, CAQSM Primary Care and [place]

## 2021-02-26 ENCOUNTER — Other Ambulatory Visit (HOSPITAL_BASED_OUTPATIENT_CLINIC_OR_DEPARTMENT_OTHER): Payer: Self-pay | Admitting: Family Medicine

## 2021-03-01 ENCOUNTER — Ambulatory Visit (HOSPITAL_BASED_OUTPATIENT_CLINIC_OR_DEPARTMENT_OTHER): Payer: Managed Care, Other (non HMO) | Admitting: Family Medicine

## 2021-03-01 ENCOUNTER — Other Ambulatory Visit: Payer: Self-pay

## 2021-03-01 ENCOUNTER — Encounter (HOSPITAL_BASED_OUTPATIENT_CLINIC_OR_DEPARTMENT_OTHER): Payer: Self-pay | Admitting: Family Medicine

## 2021-03-01 VITALS — BP 126/98 | HR 76 | Ht 72.0 in | Wt 259.0 lb

## 2021-03-01 DIAGNOSIS — R030 Elevated blood-pressure reading, without diagnosis of hypertension: Secondary | ICD-10-CM | POA: Diagnosis not present

## 2021-03-01 DIAGNOSIS — Z6835 Body mass index (BMI) 35.0-35.9, adult: Secondary | ICD-10-CM

## 2021-03-01 LAB — HEMOGLOBIN A1C
Est. average glucose Bld gHb Est-mCnc: 114 mg/dL
Hgb A1c MFr Bld: 5.6 % (ref 4.8–5.6)

## 2021-03-01 NOTE — Assessment & Plan Note (Signed)
Was able to start with Ozempic utilizing free card He is currently on 0.5 mg dosing, has been tolerating medication as well as titration to new dose.  Denies any issues with nausea, vomiting or GI upset Unfortunately, it appears insurance will not cover use of Ozempic for weight loss specifically Use of medication would be covered with diagnosis of diabetes, possibly with diagnosis of prediabetes Has not had A1c checked recently, will screen for diabetes given BMI as well as elevated blood pressure in the past. If A1c within prediabetes range, will resend prescription to pharmacy with diagnosis of elevated BMI, prediabetes to determine if this will be covered by insurance If still not covered, consider sending prescription for [facility] as alternative brand to assess for coverage Continue with lifestyle modifications including dietary and physical activity measures Other pharmacotherapy options would include starting phentermine which patient has taken in the past

## 2021-03-01 NOTE — Patient Instructions (Signed)
  Medication Instructions:  Your physician recommends that you continue on your current medications as directed. Please refer to the Current Medication list given to you today. --If you need a refill on any your medications before your next appointment, please call your pharmacy first. If no refills are authorized on file call the office.-- Lab Work: Your physician has recommended that you have lab work today: A1C If you have labs (blood work) drawn today and your tests are completely normal, you will receive your results via [place] a phone call from our staff.  Please ensure you check your voicemail in the event that you authorized detailed messages to be left on a delegated number. If you have any lab test that is abnormal or we need to change your treatment, we will call you to review the results.  Follow-Up: Your next appointment:   Your physician recommends that you schedule a follow-up appointment in: 1 MONTH with Dr. de [place]  You will receive a text message or e-mail with a link to a survey about your care and experience with [place] today! We would greatly appreciate your feedback!   Thanks for letting us be apart of your health journey!!  Primary Care and Sports Medicine   Dr. Arlina Robes [place]   We encourage you to activate your patient portal called "MyChart".  Sign up information is provided on this After Visit Summary.  MyChart is used to connect with patients for Virtual Visits (Telemedicine).  Patients are able to view lab/test results, encounter notes, upcoming appointments, etc.  Non-urgent messages can be sent to your provider as well. To learn more about what you can do with MyChart, please visit --  [email or web address].

## 2021-03-01 NOTE — Progress Notes (Signed)
    Procedures performed today:    None.  Independent interpretation of notes and tests performed by another provider:   None.  Brief History, Exam, Impression, and Recommendations:    BP (!) 126/98   Pulse 76   Ht 6' (1.829 m)   Wt 259 lb (117.5 kg)   SpO2 98%   BMI 35.13 kg/m   BMI 35.0-35.9,adult Was able to start with Ozempic utilizing free card He is currently on 0.5 mg dosing, has been tolerating medication as well as titration to new dose.  Denies any issues with nausea, vomiting or GI upset Unfortunately, it appears insurance will not cover use of Ozempic for weight loss specifically Use of medication would be covered with diagnosis of diabetes, possibly with diagnosis of prediabetes Has not had A1c checked recently, will screen for diabetes given BMI as well as elevated blood pressure in the past. If A1c within prediabetes range, will resend prescription to pharmacy with diagnosis of elevated BMI, prediabetes to determine if this will be covered by insurance If still not covered, consider sending prescription for [facility] as alternative brand to assess for coverage Continue with lifestyle modifications including dietary and physical activity measures Other pharmacotherapy options would include starting phentermine which patient has taken in the past   ___________________________________________ Janice Bodine [place], MD, ABFM, CAQSM Primary Care and [place]

## 2021-03-02 ENCOUNTER — Telehealth (HOSPITAL_BASED_OUTPATIENT_CLINIC_OR_DEPARTMENT_OTHER): Payer: Self-pay

## 2021-03-02 ENCOUNTER — Other Ambulatory Visit (HOSPITAL_BASED_OUTPATIENT_CLINIC_OR_DEPARTMENT_OTHER): Payer: Self-pay | Admitting: Family Medicine

## 2021-03-02 MED ORDER — SEMAGLUTIDE-WEIGHT MANAGEMENT 1 MG/0.5ML ~~LOC~~ SOAJ: 1.0000 mg | SUBCUTANEOUS | 1 refills | Status: DC

## 2021-03-02 NOTE — Telephone Encounter (Signed)
-----   Message from [name], MD sent at 03/02/2021  8:39 AM EDT ----- Hemoglobin A1c is at 5.6% which falls within normal range.  Given prior issue with insurance coverage for Ozempic, we can try to submit prescription for [facility] which is the same medication, but just a different brand.  Please send in for 1 mg dose which can be started after completing 4 weeks at 0.5 mg dose.  If Eugene Gould is also not covered or is cost prohibitive, then will need to consider oral medications to transition to.

## 2021-03-04 ENCOUNTER — Encounter (HOSPITAL_BASED_OUTPATIENT_CLINIC_OR_DEPARTMENT_OTHER): Payer: Self-pay

## 2021-03-25 ENCOUNTER — Telehealth (HOSPITAL_BASED_OUTPATIENT_CLINIC_OR_DEPARTMENT_OTHER): Payer: Self-pay | Admitting: Family Medicine

## 2021-03-25 NOTE — Telephone Encounter (Signed)
Pt called on 11/10 regarding his upcoming appt on 11/14 on why he needed it. Pt stated that the appt was to check the medication he was on. Pt state that he hasn't had the medication due to it being to costly and it not being called in from our office to the pharmacy. Pt has canceled appt for 11/14. Please advise.

## 2021-03-29 ENCOUNTER — Ambulatory Visit (HOSPITAL_BASED_OUTPATIENT_CLINIC_OR_DEPARTMENT_OTHER): Payer: Managed Care, Other (non HMO) | Admitting: Family Medicine

## 2021-09-09 ENCOUNTER — Encounter (HOSPITAL_BASED_OUTPATIENT_CLINIC_OR_DEPARTMENT_OTHER): Payer: Self-pay | Admitting: Family Medicine

## 2021-09-09 ENCOUNTER — Ambulatory Visit (INDEPENDENT_AMBULATORY_CARE_PROVIDER_SITE_OTHER): Payer: Managed Care, Other (non HMO) | Admitting: Family Medicine

## 2021-09-09 VITALS — BP 143/99 | HR 60 | Ht 72.0 in | Wt 267.2 lb

## 2021-09-09 DIAGNOSIS — Z0000 Encounter for general adult medical examination without abnormal findings: Secondary | ICD-10-CM | POA: Diagnosis not present

## 2021-09-09 DIAGNOSIS — E785 Hyperlipidemia, unspecified: Secondary | ICD-10-CM | POA: Insufficient documentation

## 2021-09-09 NOTE — Progress Notes (Signed)
?Subjective:   ? ?CC: Annual Physical Exam ? ?HPI:  ?Eugene Gould is a 56 y.o. presenting for annual physical ? ?I reviewed the past medical history, family history, social history, surgical history, and allergies today and no changes were needed.  Please see the problem list section below in epic for further details. ? ?Past Medical History: ?Past Medical History:  ?Diagnosis Date  ? Asthma   ? ?Past Surgical History: ?Past Surgical History:  ?Procedure Laterality Date  ? COLONOSCOPY  10/2020  ? TONSILLECTOMY    ? ?Social History: ?Social History  ? ?Socioeconomic History  ? Marital status: Married  ?  Spouse name: Not on file  ? Number of children: Not on file  ? Years of education: Not on file  ? Highest education level: Not on file  ?Occupational History  ?  Employer: [employer]  ?Tobacco Use  ? Smoking status: Never  ? Smokeless tobacco: Never  ?Vaping Use  ? Vaping Use: Never used  ?Substance and Sexual Activity  ? Alcohol use: Not Currently  ? Drug use: Never  ? Sexual activity: Yes  ?Other Topics Concern  ? Not on file  ?Social History Narrative  ? Not on file  ? ?Social Determinants of Health  ? ?Financial Resource Strain: Not on file  ?Food Insecurity: Not on file  ?Transportation Needs: Not on file  ?Physical Activity: Not on file  ?Stress: Not on file  ?Social Connections: Not on file  ? ?Family History: ?Family History  ?Problem Relation Age of Onset  ? Anuerysm Mother   ? Heart disease Father   ? Hypertension Father   ? Gout Brother   ? Alzheimer's disease Maternal Grandmother   ? Cancer Paternal Grandmother   ? COPD Paternal Grandfather   ? Colon cancer Neg Hx   ? Colon polyps Neg Hx   ? Esophageal cancer Neg Hx   ? Rectal cancer Neg Hx   ? Stomach cancer Neg Hx   ? ?Allergies: ?Allergies  ?Allergen Reactions  ? Codeine Other (See Comments)  ?  Shakes ?  ? ?Medications: See med rec. ? ?Review of Systems: No headache, visual changes, nausea, vomiting, diarrhea, constipation, dizziness, abdominal  pain, skin rash, fevers, chills, night sweats, swollen lymph nodes, weight loss, chest pain, body aches, joint swelling, muscle aches, shortness of breath, mood changes, visual or auditory hallucinations. ? ?Objective:   ? ?BP (!) 143/99   Pulse 60   Ht 6' (1.829 m)   Wt 267 lb 3.2 oz (121.2 kg)   SpO2 100%   BMI 36.24 kg/m?  ? ?General: Well Developed, well nourished, and in no acute distress.  ?Neuro: Alert and oriented x3, extra-ocular muscles intact, sensation grossly intact. Cranial nerves II through XII are intact, motor, sensory, and coordinative functions are all intact. ?HEENT: Normocephalic, atraumatic, pupils equal round reactive to light, neck supple, no masses, no lymphadenopathy, thyroid nonpalpable. Oropharynx, nasopharynx, external ear canals are unremarkable. ?Skin: Warm and dry, no rashes noted.  ?Cardiac: Regular rate and rhythm, no murmurs rubs or gallops.  ?Respiratory: Clear to auscultation bilaterally. Not using accessory muscles, speaking in full sentences.  ?Abdominal: Soft, nontender, nondistended, positive bowel sounds, no masses, no organomegaly.  ?Musculoskeletal: Shoulder, elbow, wrist, hip, knee, ankle stable, and with full range of motion. ? ?Impression and Recommendations:   ? ?Wellness examination ?Routine HCM labs ordered. HCM reviewed/discussed. Anticipatory guidance regarding healthy weight, lifestyle and choices given. ?Recommend healthy diet.  Recommend approximately 150 minutes/week of moderate intensity  exercise ?Recommend regular dental and vision exams ?Always use seatbelt/lap and shoulder restraints ?Recommend using smoke alarms and checking batteries at least twice a year ?Recommend using sunscreen when outside ?Discussed colon cancer screening recommendations, options - patient UTD with colonoscopy ?Discussed recommendations for shingles vaccine - patient has received previously ?Discussed tetanus immunization recommendations, patient UTD ? ?Plan for follow-up in 1  year or sooner as needed or as dictated by labs, particularly if A1c is elevated or if notable cholesterol issues ? ? ?___________________________________________ ?Tyleigh Mahn de [place], MD, ABFM, CAQSM ?Primary Care and Sports Medicine ?[place] ?

## 2021-09-09 NOTE — Assessment & Plan Note (Signed)
Routine HCM labs ordered. HCM reviewed/discussed. Anticipatory guidance regarding healthy weight, lifestyle and choices given. ?Recommend healthy diet.  Recommend approximately 150 minutes/week of moderate intensity exercise ?Recommend regular dental and vision exams ?Always use seatbelt/lap and shoulder restraints ?Recommend using smoke alarms and checking batteries at least twice a year ?Recommend using sunscreen when outside ?Discussed colon cancer screening recommendations, options - patient UTD with colonoscopy ?Discussed recommendations for shingles vaccine - patient has received previously ?Discussed tetanus immunization recommendations, patient UTD ?

## 2021-09-10 LAB — LIPID PANEL
Chol/HDL Ratio: 5.5 ratio — ABNORMAL HIGH (ref 0.0–5.0)
Cholesterol, Total: 193 mg/dL (ref 100–199)
HDL: 35 mg/dL — ABNORMAL LOW (ref >39)
LDL Chol Calc (NIH): 136 mg/dL — ABNORMAL HIGH (ref 0–99)
Triglycerides: 118 mg/dL (ref 0–149)
VLDL Cholesterol Cal: 22 mg/dL (ref 5–40)

## 2021-09-10 LAB — CBC WITH DIFFERENTIAL/PLATELET
Basophils Absolute: 0 10*3/uL (ref 0.0–0.2)
Basos: 1 %
EOS (ABSOLUTE): 0.2 10*3/uL (ref 0.0–0.4)
Eos: 2 %
Hematocrit: 46.1 % (ref 37.5–51.0)
Hemoglobin: 15.5 g/dL (ref 13.0–17.7)
Immature Grans (Abs): 0 10*3/uL (ref 0.0–0.1)
Immature Granulocytes: 0 %
Lymphocytes Absolute: 2.7 10*3/uL (ref 0.7–3.1)
Lymphs: 32 %
MCH: 30 pg (ref 26.6–33.0)
MCHC: 33.6 g/dL (ref 31.5–35.7)
MCV: 89 fL (ref 79–97)
Monocytes Absolute: 0.6 10*3/uL (ref 0.1–0.9)
Monocytes: 7 %
Neutrophils Absolute: 5 10*3/uL (ref 1.4–7.0)
Neutrophils: 58 %
Platelets: 308 10*3/uL (ref 150–450)
RBC: 5.17 x10E6/uL (ref 4.14–5.80)
RDW: 13.2 % (ref 11.6–15.4)
WBC: 8.5 10*3/uL (ref 3.4–10.8)

## 2021-09-10 LAB — COMPREHENSIVE METABOLIC PANEL
ALT: 51 IU/L — ABNORMAL HIGH (ref 0–44)
AST: 27 IU/L (ref 0–40)
Albumin/Globulin Ratio: 1.7 (ref 1.2–2.2)
Albumin: 4.3 g/dL (ref 3.8–4.9)
Alkaline Phosphatase: 98 IU/L (ref 44–121)
BUN/Creatinine Ratio: 13 (ref 9–20)
BUN: 13 mg/dL (ref 6–24)
Bilirubin Total: 0.5 mg/dL (ref 0.0–1.2)
CO2: 24 mmol/L (ref 20–29)
Calcium: 9 mg/dL (ref 8.7–10.2)
Chloride: 101 mmol/L (ref 96–106)
Creatinine, Ser: 0.99 mg/dL (ref 0.76–1.27)
Globulin, Total: 2.6 g/dL (ref 1.5–4.5)
Glucose: 91 mg/dL (ref 70–99)
Potassium: 4.5 mmol/L (ref 3.5–5.2)
Sodium: 137 mmol/L (ref 134–144)
Total Protein: 6.9 g/dL (ref 6.0–8.5)
eGFR: 91 mL/min/{1.73_m2} (ref >59)

## 2021-09-10 LAB — HEMOGLOBIN A1C
Est. average glucose Bld gHb Est-mCnc: 120 mg/dL
Hgb A1c MFr Bld: 5.8 % — ABNORMAL HIGH (ref 4.8–5.6)

## 2021-09-10 LAB — TSH RFX ON ABNORMAL TO FREE T4: TSH: 1.26 u[IU]/mL (ref 0.450–4.500)

## 2021-09-13 ENCOUNTER — Encounter (HOSPITAL_BASED_OUTPATIENT_CLINIC_OR_DEPARTMENT_OTHER): Payer: Self-pay | Admitting: Family Medicine

## 2021-09-13 DIAGNOSIS — Z6835 Body mass index (BMI) 35.0-35.9, adult: Secondary | ICD-10-CM

## 2021-09-13 DIAGNOSIS — R7303 Prediabetes: Secondary | ICD-10-CM

## 2021-09-14 DIAGNOSIS — R7303 Prediabetes: Secondary | ICD-10-CM | POA: Insufficient documentation

## 2021-09-14 MED ORDER — OZEMPIC (0.25 OR 0.5 MG/DOSE) 2 MG/1.5ML ~~LOC~~ SOPN: PEN_INJECTOR | SUBCUTANEOUS | 0 refills | Status: DC

## 2021-09-15 NOTE — Progress Notes (Signed)
Pt was called and I left voicemail for him to give [place] a call back to go over lab results.

## 2021-09-17 MED ORDER — OZEMPIC (0.25 OR 0.5 MG/DOSE) 2 MG/1.5ML ~~LOC~~ SOPN: PEN_INJECTOR | SUBCUTANEOUS | 0 refills | Status: AC

## 2021-09-20 ENCOUNTER — Telehealth (HOSPITAL_BASED_OUTPATIENT_CLINIC_OR_DEPARTMENT_OTHER): Payer: Self-pay

## 2021-09-20 NOTE — Telephone Encounter (Signed)
Pt pa was approved for ozempic (0.25 or 0.5 MG/DOSE) '2MG'$ /3ML.  ?

## 2021-11-01 ENCOUNTER — Encounter (HOSPITAL_BASED_OUTPATIENT_CLINIC_OR_DEPARTMENT_OTHER): Payer: Self-pay | Admitting: Family Medicine

## 2021-11-01 DIAGNOSIS — R7303 Prediabetes: Secondary | ICD-10-CM

## 2021-11-01 DIAGNOSIS — Z6835 Body mass index (BMI) 35.0-35.9, adult: Secondary | ICD-10-CM

## 2021-11-04 ENCOUNTER — Encounter (HOSPITAL_BASED_OUTPATIENT_CLINIC_OR_DEPARTMENT_OTHER): Payer: Self-pay

## 2021-11-04 MED ORDER — SEMAGLUTIDE (1 MG/DOSE) 4 MG/3ML ~~LOC~~ SOPN
1.0000 mg | PEN_INJECTOR | SUBCUTANEOUS | 1 refills | Status: DC
Start: 2021-11-04 — End: 2021-12-15

## 2021-11-04 NOTE — Telephone Encounter (Signed)
Tried calling pt to sch appt. Vm was full sent pt a MyChart message.

## 2021-12-15 ENCOUNTER — Encounter (HOSPITAL_BASED_OUTPATIENT_CLINIC_OR_DEPARTMENT_OTHER): Payer: Self-pay | Admitting: Family Medicine

## 2021-12-15 ENCOUNTER — Ambulatory Visit (HOSPITAL_BASED_OUTPATIENT_CLINIC_OR_DEPARTMENT_OTHER): Payer: Managed Care, Other (non HMO) | Admitting: Family Medicine

## 2021-12-15 DIAGNOSIS — Z6835 Body mass index (BMI) 35.0-35.9, adult: Secondary | ICD-10-CM | POA: Diagnosis not present

## 2021-12-15 DIAGNOSIS — R7303 Prediabetes: Secondary | ICD-10-CM | POA: Diagnosis not present

## 2021-12-15 MED ORDER — OZEMPIC (2 MG/DOSE) 8 MG/3ML ~~LOC~~ SOPN: 2.0000 mg | PEN_INJECTOR | SUBCUTANEOUS | 1 refills | Status: DC

## 2021-12-15 NOTE — Progress Notes (Deleted)
260.9lb

## 2021-12-15 NOTE — Patient Instructions (Signed)
  Medication Instructions:  Your physician recommends that you continue on your current medications as directed. Please refer to the Current Medication list given to you today. --If you need a refill on any your medications before your next appointment, please call your pharmacy first. If no refills are authorized on file call the office.-- Lab Work: Your physician has recommended that you have lab work today: No If you have labs (blood work) drawn today and your tests are completely normal, you will receive your results via [place] a phone call from our staff.  Please ensure you check your voicemail in the event that you authorized detailed messages to be left on a delegated number. If you have any lab test that is abnormal or we need to change your treatment, we will call you to review the results.  Referrals/Procedures/Imaging: No  Follow-Up: Your next appointment:   Your physician recommends that you schedule a follow-up appointment in as needed with Dr. de [place].  You will receive a text message or e-mail with a link to a survey about your care and experience with [place] today! We would greatly appreciate your feedback!   Thanks for letting us be apart of your health journey!!  Primary Care and Sports Medicine   Dr. Arlina Robes [place]   We encourage you to activate your patient portal called "MyChart".  Sign up information is provided on this After Visit Summary.  MyChart is used to connect with patients for Virtual Visits (Telemedicine).  Patients are able to view lab/test results, encounter notes, upcoming appointments, etc.  Non-urgent messages can be sent to your provider as well. To learn more about what you can do with MyChart, please visit --  [email or web address].

## 2021-12-15 NOTE — Assessment & Plan Note (Signed)
Patient continues with Ozempic, has been tolerating medication well.  Denies any issues with nausea or vomiting.  Currently has been doing the 1 mg weekly dose.  Occasionally will forget the dose and may take a few days late, otherwise has not had any issues.  Feels that since being on the medication he has had at least a 5 pound weight loss.  He continues with lifestyle modifications as well. Discussed options with patient today, will proceed with dose titration to 2 mg weekly dose.  Again cautioned on potential side effects for patient to be mindful of.  If notable side effects do occur, discussed that these generally will improve with gradual adjustment to medication, however can transition back to 1 mg weekly dose if continuing to have issues Plan for follow-up in about 6 to 8 weeks to monitor progress or sooner as needed

## 2021-12-15 NOTE — Assessment & Plan Note (Signed)
Continues with use of Ozempic to assist with weight loss as well as better control blood sugars given underlying prediabetes See above for management Continue with current medication regimen as well as lifestyle modifications

## 2021-12-15 NOTE — Progress Notes (Signed)
    Procedures performed today:    None.  Independent interpretation of notes and tests performed by another provider:   None.  Brief History, Exam, Impression, and Recommendations:    BP (!) 149/107   Pulse 79   Ht 6' (1.829 m)   Wt 260 lb 14.4 oz (118.3 kg)   SpO2 99%   BMI 35.38 kg/m   Prediabetes Patient continues with Ozempic, has been tolerating medication well.  Denies any issues with nausea or vomiting.  Currently has been doing the 1 mg weekly dose.  Occasionally will forget the dose and may take a few days late, otherwise has not had any issues.  Feels that since being on the medication he has had at least a 5 pound weight loss.  He continues with lifestyle modifications as well. Discussed options with patient today, will proceed with dose titration to 2 mg weekly dose.  Again cautioned on potential side effects for patient to be mindful of.  If notable side effects do occur, discussed that these generally will improve with gradual adjustment to medication, however can transition back to 1 mg weekly dose if continuing to have issues Plan for follow-up in about 6 to 8 weeks to monitor progress or sooner as needed  BMI 35.0-35.9,adult Continues with use of Ozempic to assist with weight loss as well as better control blood sugars given underlying prediabetes See above for management Continue with current medication regimen as well as lifestyle modifications  Return in about 2 months (around 02/14/2022) for PreDM, Ozempic.   ___________________________________________ Jamira Barfuss de [place], MD, ABFM, [facility] [place]

## 2022-02-14 ENCOUNTER — Ambulatory Visit (HOSPITAL_BASED_OUTPATIENT_CLINIC_OR_DEPARTMENT_OTHER): Payer: Managed Care, Other (non HMO) | Admitting: Family Medicine

## 2022-02-18 ENCOUNTER — Ambulatory Visit (HOSPITAL_BASED_OUTPATIENT_CLINIC_OR_DEPARTMENT_OTHER): Payer: Managed Care, Other (non HMO) | Admitting: Family Medicine

## 2022-02-18 ENCOUNTER — Encounter (HOSPITAL_BASED_OUTPATIENT_CLINIC_OR_DEPARTMENT_OTHER): Payer: Self-pay

## 2022-02-24 ENCOUNTER — Encounter (HOSPITAL_BASED_OUTPATIENT_CLINIC_OR_DEPARTMENT_OTHER): Payer: Self-pay | Admitting: Family Medicine

## 2022-02-24 ENCOUNTER — Ambulatory Visit (HOSPITAL_BASED_OUTPATIENT_CLINIC_OR_DEPARTMENT_OTHER): Payer: Managed Care, Other (non HMO) | Admitting: Family Medicine

## 2022-02-24 VITALS — BP 137/105 | HR 63 | Temp 97.6°F | Ht 72.0 in | Wt 252.0 lb

## 2022-02-24 DIAGNOSIS — Z6835 Body mass index (BMI) 35.0-35.9, adult: Secondary | ICD-10-CM

## 2022-02-24 DIAGNOSIS — R7303 Prediabetes: Secondary | ICD-10-CM

## 2022-02-24 DIAGNOSIS — Z23 Encounter for immunization: Secondary | ICD-10-CM | POA: Diagnosis not present

## 2022-02-24 DIAGNOSIS — R030 Elevated blood-pressure reading, without diagnosis of hypertension: Secondary | ICD-10-CM

## 2022-02-24 MED ORDER — OZEMPIC (2 MG/DOSE) 8 MG/3ML ~~LOC~~ SOPN: 2.0000 mg | PEN_INJECTOR | SUBCUTANEOUS | 1 refills | Status: DC

## 2022-02-24 NOTE — Assessment & Plan Note (Signed)
Patient continues with semaglutide better control blood sugar and to work towards gradual weight loss.  He has been administering 2 mg weekly and has been tolerating this dose, denies any issues with nausea or vomiting.  On review of chart he has lost about 8 pounds over the last he also continues with lifestyle modifications He does not have any specific questions or concerns today, is requesting refill of medication today We can continue with use of semaglutide, refill sent to pharmacy.  Recommend continuing with lifestyle modifications as well

## 2022-02-24 NOTE — Patient Instructions (Signed)
  Medication Instructions:  Your physician recommends that you continue on your current medications as directed. Please refer to the Current Medication list given to you today. --If you need a refill on any your medications before your next appointment, please call your pharmacy first. If no refills are authorized on file call the office.-- Lab Work: Your physician has recommended that you have lab work today: No If you have labs (blood work) drawn today and your tests are completely normal, you will receive your results via [place] a phone call from our staff.  Please ensure you check your voicemail in the event that you authorized detailed messages to be left on a delegated number. If you have any lab test that is abnormal or we need to change your treatment, we will call you to review the results.  Referrals/Procedures/Imaging: No  Follow-Up: Your next appointment:   Your physician recommends that you schedule a follow-up appointment in: 1-2 months follow-up with Dr. de [place].  You will receive a text message or e-mail with a link to a survey about your care and experience with [place] today! We would greatly appreciate your feedback!   Thanks for letting us be apart of your health journey!!  Primary Care and Sports Medicine   Dr. Arlina Robes [place]   We encourage you to activate your patient portal called "MyChart".  Sign up information is provided on this After Visit Summary.  MyChart is used to connect with patients for Virtual Visits (Telemedicine).  Patients are able to view lab/test results, encounter notes, upcoming appointments, etc.  Non-urgent messages can be sent to your provider as well. To learn more about what you can do with MyChart, please visit --  [email or web address].

## 2022-02-24 NOTE — Progress Notes (Signed)
    Procedures performed today:    None.  Independent interpretation of notes and tests performed by another provider:   None.  Brief History, Exam, Impression, and Recommendations:    BP (!) 137/105   Pulse 63   Temp 97.6 F (36.4 C) (Oral)   Ht 6' (1.829 m)   Wt 252 lb (114.3 kg)   SpO2 100%   BMI 34.18 kg/m   Prediabetes Patient continues with semaglutide better control blood sugar and to work towards gradual weight loss.  He has been administering 2 mg weekly and has been tolerating this dose, denies any issues with nausea or vomiting.  On review of chart he has lost about 8 pounds over the last he also continues with lifestyle modifications He does not have any specific questions or concerns today, is requesting refill of medication today We can continue with use of semaglutide, refill sent to pharmacy.  Recommend continuing with lifestyle modifications as well  Elevated BP without diagnosis of hypertension Blood pressure borderline in office today, patient continues working on lifestyle modifications, is working towards healthy, gradual weight loss, has also been trying to reduce salt in the diet Recommend intermittent monitoring at home, DASH diet We will continue to monitor blood pressure to determine need for starting pharmacotherapy in the future  Return in about 3 months (around 05/27/2022) for PreDM, BP. Recommended seasonal influenza vaccine, patient amenable, administered today   ___________________________________________ Kalisha Keadle de [place], MD, ABFM, [facility] and [place]

## 2022-02-24 NOTE — Assessment & Plan Note (Signed)
Blood pressure borderline in office today, patient continues working on lifestyle modifications, is working towards healthy, gradual weight loss, has also been trying to reduce salt in the diet Recommend intermittent monitoring at home, DASH diet We will continue to monitor blood pressure to determine need for starting pharmacotherapy in the future

## 2022-04-11 ENCOUNTER — Encounter (HOSPITAL_BASED_OUTPATIENT_CLINIC_OR_DEPARTMENT_OTHER): Payer: Self-pay | Admitting: Family Medicine

## 2022-04-12 ENCOUNTER — Other Ambulatory Visit (HOSPITAL_BASED_OUTPATIENT_CLINIC_OR_DEPARTMENT_OTHER): Payer: Self-pay

## 2022-04-12 ENCOUNTER — Other Ambulatory Visit (HOSPITAL_BASED_OUTPATIENT_CLINIC_OR_DEPARTMENT_OTHER): Payer: Self-pay | Admitting: Family Medicine

## 2022-04-12 DIAGNOSIS — R7303 Prediabetes: Secondary | ICD-10-CM

## 2022-04-12 DIAGNOSIS — Z6835 Body mass index (BMI) 35.0-35.9, adult: Secondary | ICD-10-CM

## 2022-04-12 MED ORDER — OZEMPIC (2 MG/DOSE) 8 MG/3ML ~~LOC~~ SOPN
2.0000 mg | PEN_INJECTOR | SUBCUTANEOUS | 1 refills | Status: DC
  Filled 2022-04-12: qty 3, 28d supply, fill #0

## 2022-04-13 ENCOUNTER — Other Ambulatory Visit (HOSPITAL_BASED_OUTPATIENT_CLINIC_OR_DEPARTMENT_OTHER): Payer: Self-pay

## 2022-06-01 ENCOUNTER — Ambulatory Visit (HOSPITAL_BASED_OUTPATIENT_CLINIC_OR_DEPARTMENT_OTHER): Payer: Managed Care, Other (non HMO) | Admitting: Family Medicine

## 2022-06-01 ENCOUNTER — Other Ambulatory Visit (HOSPITAL_BASED_OUTPATIENT_CLINIC_OR_DEPARTMENT_OTHER): Payer: Self-pay

## 2022-06-01 ENCOUNTER — Encounter (HOSPITAL_BASED_OUTPATIENT_CLINIC_OR_DEPARTMENT_OTHER): Payer: Self-pay | Admitting: Family Medicine

## 2022-06-01 VITALS — BP 127/108 | HR 57 | Ht 72.0 in | Wt 241.0 lb

## 2022-06-01 DIAGNOSIS — E669 Obesity, unspecified: Secondary | ICD-10-CM

## 2022-06-01 DIAGNOSIS — Z6835 Body mass index (BMI) 35.0-35.9, adult: Secondary | ICD-10-CM | POA: Diagnosis not present

## 2022-06-01 DIAGNOSIS — R030 Elevated blood-pressure reading, without diagnosis of hypertension: Secondary | ICD-10-CM

## 2022-06-01 DIAGNOSIS — Z0000 Encounter for general adult medical examination without abnormal findings: Secondary | ICD-10-CM

## 2022-06-01 DIAGNOSIS — R7303 Prediabetes: Secondary | ICD-10-CM

## 2022-06-01 MED ORDER — OZEMPIC (2 MG/DOSE) 8 MG/3ML ~~LOC~~ SOPN
2.0000 mg | PEN_INJECTOR | SUBCUTANEOUS | 1 refills | Status: DC
  Filled 2022-06-01: qty 3, 28d supply, fill #0
  Filled 2022-07-12 – 2022-07-22 (×2): qty 3, 28d supply, fill #1
  Filled 2022-08-22: qty 3, 28d supply, fill #2
  Filled 2022-09-22: qty 3, 28d supply, fill #3
  Filled 2022-10-19 (×2): qty 3, 28d supply, fill #4
  Filled 2023-01-02: qty 3, 28d supply, fill #5

## 2022-06-01 NOTE — Assessment & Plan Note (Signed)
Blood pressure normal in office today.  Patient has been working on lifestyle modifications, gradual weight loss as above.  No issues related chest pain or headaches, no lightheadedness or dizziness. No need for pharmacologic intervention at this time, can continue with lifestyle modifications, working towards healthy, gradual weight loss.  Recommend intermittent monitoring at home, DASH diet

## 2022-06-01 NOTE — Assessment & Plan Note (Signed)
Patient continues with Ozempic and regards to blood sugar control as well as assisting with weight loss efforts.  He continues with 2 mg dose and has been tolerating medication well.  Denies any concerns related to nausea, vomiting.  He has noticed weight loss and by our scale he has lost a little over 10 pounds since last office visit. At this time, we will continue with Ozempic at current dose and continue to monitor progress with medication We will recheck hemoglobin A1c shortly before next appointment

## 2022-06-01 NOTE — Progress Notes (Signed)
    Procedures performed today:    None.  Independent interpretation of notes and tests performed by another provider:   None.  Brief History, Exam, Impression, and Recommendations:    BP (!) 127/108 (BP Location: Left Arm, Patient Position: Sitting, Cuff Size: Large)   Pulse (!) 57   Ht 6' (1.829 m)   Wt 241 lb (109.3 kg)   SpO2 99%   BMI 32.69 kg/m   Prediabetes Patient continues with Ozempic and regards to blood sugar control as well as assisting with weight loss efforts.  He continues with 2 mg dose and has been tolerating medication well.  Denies any concerns related to nausea, vomiting.  He has noticed weight loss and by our scale he has lost a little over 10 pounds since last office visit. At this time, we will continue with Ozempic at current dose and continue to monitor progress with medication We will recheck hemoglobin A1c shortly before next appointment  Elevated BP without diagnosis of hypertension Blood pressure normal in office today.  Patient has been working on lifestyle modifications, gradual weight loss as above.  No issues related chest pain or headaches, no lightheadedness or dizziness. No need for pharmacologic intervention at this time, can continue with lifestyle modifications, working towards healthy, gradual weight loss.  Recommend intermittent monitoring at home, DASH diet  Return in about 4 months (around 09/30/2022) for CPE with FBW a few days.   ___________________________________________ [place] Doh[place] de [place], MD, ABFM, [facility] and [place]

## 2022-07-12 ENCOUNTER — Telehealth: Payer: Managed Care, Other (non HMO) | Admitting: Nurse Practitioner

## 2022-07-12 DIAGNOSIS — B356 Tinea cruris: Secondary | ICD-10-CM

## 2022-07-13 NOTE — Progress Notes (Signed)
E-Visit for [employer]  We are sorry that you are not feeling well. Here is how we plan to help!  Based on what you shared with me it looks like you have tinea cruris, or "Jock Itch".  The symptoms of Jock Itch include red, peeling, itchy rash that affects the groin (crease where the leg meets the trunk).  This fungal infection can be spread through shared towels, clothing, bedding, or hard surfaces (particularly in moist areas) such as shower stalls, locker room floors, or pool area that has the fungus present. If you have a fungal infection on one part of your body, you can also spread it to other parts. For instance, men with a fungal infection on their feet sometimes spread it to their groin.  I am recommending:Clotrimazole 1% cream or gel, apply to area twice per day OTC  Prescription medications are only indicated for an extensive rash or if over the counter treatments have failed.  HOME CARE:  Keep affected area clean, dry, and cool. Wash with soap and shampoo after sports or exercise and dry yourself well after bathing or swimming Wear cotton underwear and change them if they become damp or sweaty. Avoid using swimming pools, public showers, or baths.  GET HELP RIGHT AWAY IF:  Symptoms that don't away after treatment. Severe itching that persists. If your rash spreads or swells. If your rash begins to have drainage or smell. You develop a fever.  MAKE SURE YOU   Understand these instructions. Will watch your condition. Will get help right away if you are not doing well or get worse.  Thank you for choosing an e-visit.  Your e-visit answers were reviewed by a board certified advanced clinical practitioner to complete your personal care plan. Depending upon the condition, your plan could have included both over the counter or prescription medications.  Please review your pharmacy choice. Make sure the pharmacy is open so you can pick up prescription now. If there is a  problem, you may contact your provider through [employer] and have the prescription routed to another pharmacy.  Your safety is important to [place]. If you have drug allergies check your prescription carefully.   For the next 24 hours you can use MyChart to ask questions about today's visit, request a non-urgent call back, or ask for a work or school excuse. You will get an email in the next two days asking about your experience. I hope that your e-visit has been valuable and will speed your recovery.    Mary-Margaret Hassell Done, FNP   5-10 minutes spent reviewing and documenting in chart.   References or for more information:  [email or web address].hu [email or web address]&source=search_result&selectedTitle=3~52&usage_type=default&display_rank=3 itch

## 2022-07-21 ENCOUNTER — Other Ambulatory Visit (HOSPITAL_BASED_OUTPATIENT_CLINIC_OR_DEPARTMENT_OTHER): Payer: Self-pay

## 2022-07-22 ENCOUNTER — Other Ambulatory Visit (HOSPITAL_BASED_OUTPATIENT_CLINIC_OR_DEPARTMENT_OTHER): Payer: Self-pay

## 2022-07-25 ENCOUNTER — Other Ambulatory Visit (HOSPITAL_BASED_OUTPATIENT_CLINIC_OR_DEPARTMENT_OTHER): Payer: Self-pay

## 2022-09-05 ENCOUNTER — Ambulatory Visit (HOSPITAL_BASED_OUTPATIENT_CLINIC_OR_DEPARTMENT_OTHER): Payer: Managed Care, Other (non HMO)

## 2022-09-12 ENCOUNTER — Encounter (HOSPITAL_BASED_OUTPATIENT_CLINIC_OR_DEPARTMENT_OTHER): Payer: Managed Care, Other (non HMO) | Admitting: Family Medicine

## 2022-09-23 ENCOUNTER — Other Ambulatory Visit (HOSPITAL_BASED_OUTPATIENT_CLINIC_OR_DEPARTMENT_OTHER): Payer: Self-pay | Admitting: Family Medicine

## 2022-09-23 ENCOUNTER — Ambulatory Visit (HOSPITAL_BASED_OUTPATIENT_CLINIC_OR_DEPARTMENT_OTHER): Payer: Managed Care, Other (non HMO)

## 2022-09-24 LAB — CBC WITH DIFFERENTIAL/PLATELET
Basophils Absolute: 0 10*3/uL (ref 0.0–0.2)
Basos: 1 %
EOS (ABSOLUTE): 0.1 10*3/uL (ref 0.0–0.4)
Eos: 1 %
Hematocrit: 47 % (ref 37.5–51.0)
Hemoglobin: 15.8 g/dL (ref 13.0–17.7)
Immature Grans (Abs): 0 10*3/uL (ref 0.0–0.1)
Immature Granulocytes: 0 %
Lymphocytes Absolute: 2.4 10*3/uL (ref 0.7–3.1)
Lymphs: 35 %
MCH: 29.8 pg (ref 26.6–33.0)
MCHC: 33.6 g/dL (ref 31.5–35.7)
MCV: 89 fL (ref 79–97)
Monocytes Absolute: 0.4 10*3/uL (ref 0.1–0.9)
Monocytes: 6 %
Neutrophils Absolute: 3.8 10*3/uL (ref 1.4–7.0)
Neutrophils: 57 %
Platelets: 297 10*3/uL (ref 150–450)
RBC: 5.3 x10E6/uL (ref 4.14–5.80)
RDW: 13.2 % (ref 11.6–15.4)
WBC: 6.7 10*3/uL (ref 3.4–10.8)

## 2022-09-24 LAB — COMPREHENSIVE METABOLIC PANEL
ALT: 21 IU/L (ref 0–44)
AST: 18 IU/L (ref 0–40)
Albumin/Globulin Ratio: 1.8 (ref 1.2–2.2)
Albumin: 4.5 g/dL (ref 3.8–4.9)
Alkaline Phosphatase: 90 IU/L (ref 44–121)
BUN/Creatinine Ratio: 9 (ref 9–20)
BUN: 10 mg/dL (ref 6–24)
Bilirubin Total: 0.4 mg/dL (ref 0.0–1.2)
CO2: 22 mmol/L (ref 20–29)
Calcium: 9.4 mg/dL (ref 8.7–10.2)
Chloride: 104 mmol/L (ref 96–106)
Creatinine, Ser: 1.12 mg/dL (ref 0.76–1.27)
Globulin, Total: 2.5 g/dL (ref 1.5–4.5)
Glucose: 90 mg/dL (ref 70–99)
Potassium: 4.6 mmol/L (ref 3.5–5.2)
Sodium: 142 mmol/L (ref 134–144)
Total Protein: 7 g/dL (ref 6.0–8.5)
eGFR: 78 mL/min/{1.73_m2} (ref >59)

## 2022-09-24 LAB — LIPID PANEL
Chol/HDL Ratio: 4.5 ratio (ref 0.0–5.0)
Cholesterol, Total: 174 mg/dL (ref 100–199)
HDL: 39 mg/dL — ABNORMAL LOW (ref >39)
LDL Chol Calc (NIH): 123 mg/dL — ABNORMAL HIGH (ref 0–99)
Triglycerides: 63 mg/dL (ref 0–149)
VLDL Cholesterol Cal: 12 mg/dL (ref 5–40)

## 2022-09-24 LAB — HEMOGLOBIN A1C
Est. average glucose Bld gHb Est-mCnc: 111 mg/dL
Hgb A1c MFr Bld: 5.5 % (ref 4.8–5.6)

## 2022-09-24 LAB — TSH RFX ON ABNORMAL TO FREE T4: TSH: 1.53 u[IU]/mL (ref 0.450–4.500)

## 2022-09-26 ENCOUNTER — Ambulatory Visit: Payer: Self-pay | Admitting: *Deleted

## 2022-09-30 ENCOUNTER — Encounter (HOSPITAL_BASED_OUTPATIENT_CLINIC_OR_DEPARTMENT_OTHER): Payer: Managed Care, Other (non HMO) | Admitting: Family Medicine

## 2022-10-11 ENCOUNTER — Other Ambulatory Visit (HOSPITAL_BASED_OUTPATIENT_CLINIC_OR_DEPARTMENT_OTHER): Payer: Self-pay | Admitting: Family Medicine

## 2022-10-11 ENCOUNTER — Ambulatory Visit: Payer: Self-pay | Admitting: *Deleted

## 2022-10-19 ENCOUNTER — Other Ambulatory Visit (HOSPITAL_BASED_OUTPATIENT_CLINIC_OR_DEPARTMENT_OTHER): Payer: Self-pay

## 2022-10-19 ENCOUNTER — Encounter (HOSPITAL_BASED_OUTPATIENT_CLINIC_OR_DEPARTMENT_OTHER): Payer: Self-pay | Admitting: Family Medicine

## 2022-10-19 ENCOUNTER — Ambulatory Visit (INDEPENDENT_AMBULATORY_CARE_PROVIDER_SITE_OTHER): Payer: Managed Care, Other (non HMO) | Admitting: Family Medicine

## 2022-10-19 ENCOUNTER — Other Ambulatory Visit: Payer: Self-pay

## 2022-10-19 VITALS — BP 133/100 | HR 64 | Temp 97.8°F | Ht 72.0 in | Wt 236.0 lb

## 2022-10-19 DIAGNOSIS — Z0000 Encounter for general adult medical examination without abnormal findings: Secondary | ICD-10-CM

## 2022-10-19 NOTE — Patient Instructions (Signed)
  Medication Instructions:  Your physician recommends that you continue on your current medications as directed. Please refer to the Current Medication list given to you today. --If you need a refill on any your medications before your next appointment, please call your pharmacy first. If no refills are authorized on file call the office.-- Lab Work: Your physician has recommended that you have lab work today: No If you have labs (blood work) drawn today and your tests are completely normal, you will receive your results via MyChart message OR a phone call from our staff.  Please ensure you check your voicemail in the event that you authorized detailed messages to be left on a delegated number. If you have any lab test that is abnormal or we need to change your treatment, we will call you to review the results.  Referrals/Procedures/Imaging: No  Follow-Up: Your next appointment:   Your physician recommends that you schedule a follow-up appointment in: 4-6 months with Dr. de [place].  You will receive a text message or e-mail with a link to a survey about your care and experience with us today! We would greatly appreciate your feedback!   Thanks for letting us be apart of your health journey!!  Primary Care and Sports Medicine   Dr. Raymond de [place]   We encourage you to activate your patient portal called "MyChart".  Sign up information is provided on this After Visit Summary.  MyChart is used to connect with patients for Virtual Visits (Telemedicine).  Patients are able to view lab/test results, encounter notes, upcoming appointments, etc.  Non-urgent messages can be sent to your provider as well. To learn more about what you can do with MyChart, please visit --  [email or web address]    

## 2022-10-19 NOTE — Progress Notes (Signed)
Subjective:    CC: Annual Physical Exam  HPI:  Eugene Gould is a 57 y.o. presenting for annual physical  I reviewed the past medical history, family history, social history, surgical history, and allergies today and no changes were needed.  Please see the problem list section below in epic for further details.  Past Medical History: Past Medical History:  Diagnosis Date   Asthma    Past Surgical History: Past Surgical History:  Procedure Laterality Date   COLONOSCOPY  10/2020   TONSILLECTOMY     Social History: Social History   Socioeconomic History   Marital status: Married    Spouse name: Not on file   Number of children: Not on file   Years of education: Not on file   Highest education level: Not on file  Occupational History    Employer: [employer]  Tobacco Use   Smoking status: Never   Smokeless tobacco: Never  Vaping Use   Vaping Use: Never used  Substance and Sexual Activity   Alcohol use: Not Currently   Drug use: Never   Sexual activity: Yes  Other Topics Concern   Not on file  Social History Narrative   Not on file   Social Determinants of Health   Financial Resource Strain: Not on file  Food Insecurity: Not on file  Transportation Needs: Not on file  Physical Activity: Not on file  Stress: Not on file  Social Connections: Not on file   Family History: Family History  Problem Relation Age of Onset   Anuerysm Mother    Heart disease Father    Hypertension Father    Gout Brother    Alzheimer's disease Maternal Grandmother    Cancer Paternal Grandmother    COPD Paternal Grandfather    Colon cancer Neg Hx    Colon polyps Neg Hx    Esophageal cancer Neg Hx    Rectal cancer Neg Hx    Stomach cancer Neg Hx    Allergies: Allergies  Allergen Reactions   Codeine Other (See Comments)    Shakes    Medications: See med rec.  Review of Systems: No headache, visual changes, nausea, vomiting, diarrhea, constipation, dizziness, abdominal  pain, skin rash, fevers, chills, night sweats, swollen lymph nodes, weight loss, chest pain, body aches, joint swelling, muscle aches, shortness of breath, mood changes, visual or auditory hallucinations.  Objective:    BP (!) 133/100 (BP Location: Left Arm, Patient Position: Sitting, Cuff Size: Normal)   Pulse 64   Temp 97.8 F (36.6 C) (Oral)   Ht 6' (1.829 m)   Wt 236 lb (107 kg)   SpO2 100%   BMI 32.01 kg/m   General: Well Developed, well nourished, and in no acute distress. Neuro: Alert and oriented x3, extra-ocular muscles intact, sensation grossly intact. Cranial nerves II through XII are intact, motor, sensory, and coordinative functions are all intact. HEENT: Normocephalic, atraumatic, pupils equal round reactive to light, neck supple, no masses, no lymphadenopathy, thyroid nonpalpable. Oropharynx, nasopharynx, external ear canals are unremarkable. Skin: Warm and dry, no rashes noted. Cardiac: Regular rate and rhythm, no murmurs rubs or gallops. Respiratory: Clear to auscultation bilaterally. Not using accessory muscles, speaking in full sentences. Abdominal: Soft, nontender, nondistended, positive bowel sounds, no masses, no organomegaly. Musculoskeletal: Shoulder, elbow, wrist, hip, knee, ankle stable, and with full range of motion.  Impression and Recommendations:    Wellness examination Routine HCM labs reviewed. HCM reviewed/discussed. Anticipatory guidance regarding healthy weight, lifestyle and choices given.  Recommend healthy diet.  Recommend approximately 150 minutes/week of moderate intensity exercise Recommend regular dental and vision exams Always use seatbelt/lap and shoulder restraints Recommend using smoke alarms and checking batteries at least twice a year Recommend using sunscreen when outside Discussed colon cancer screening recommendations, options - patient UTD with colonoscopy Discussed recommendations for shingles vaccine - patient has received  previously Discussed tetanus immunization recommendations, patient UTD  Return in about 4 months (around 02/18/2023) for BP and weight.   ___________________________________________ Cristina Ceniceros de [place], MD, ABFM, [facility] and [facility]

## 2022-10-19 NOTE — Assessment & Plan Note (Signed)
Routine HCM labs reviewed. HCM reviewed/discussed. Anticipatory guidance regarding healthy weight, lifestyle and choices given. Recommend healthy diet.  Recommend approximately 150 minutes/week of moderate intensity exercise Recommend regular dental and vision exams Always use seatbelt/lap and shoulder restraints Recommend using smoke alarms and checking batteries at least twice a year Recommend using sunscreen when outside Discussed colon cancer screening recommendations, options - patient UTD with colonoscopy Discussed recommendations for shingles vaccine - patient has received previously Discussed tetanus immunization recommendations, patient UTD

## 2023-01-02 ENCOUNTER — Telehealth: Payer: BC Managed Care – PPO | Admitting: Physician Assistant

## 2023-01-02 DIAGNOSIS — J069 Acute upper respiratory infection, unspecified: Secondary | ICD-10-CM

## 2023-01-02 MED ORDER — IPRATROPIUM BROMIDE 0.03 % NA SOLN
2.0000 | Freq: Two times a day (BID) | NASAL | 0 refills | Status: DC
Start: 2023-01-02 — End: 2023-05-26

## 2023-01-02 MED ORDER — BENZONATATE 100 MG PO CAPS
100.0000 mg | ORAL_CAPSULE | Freq: Three times a day (TID) | ORAL | 0 refills | Status: DC | PRN
Start: 2023-01-02 — End: 2023-05-26

## 2023-01-02 NOTE — Progress Notes (Signed)
E-Visit for Upper Respiratory Infection   We are sorry you are not feeling well.  Here is how we plan to help!  Based on what you have shared with me, it looks like you may have a viral upper respiratory infection.  Upper respiratory infections are caused by a large number of viruses; however, rhinovirus is the most common cause.   Symptoms vary from person to person, with common symptoms including sore throat, cough, fatigue or lack of energy and feeling of general discomfort.  A low-grade fever of up to 100.4 may present, but is often uncommon.  Symptoms vary however, and are closely related to a person's age or underlying illnesses.  The most common symptoms associated with an upper respiratory infection are nasal discharge or congestion, cough, sneezing, headache and pressure in the ears and face.  These symptoms usually persist for about 3 to 10 days, but can last up to 2 weeks.  It is important to know that upper respiratory infections do not cause serious illness or complications in most cases.    Upper respiratory infections can be transmitted from person to person, with the most common method of transmission being a person's hands.  The virus is able to live on the skin and can infect other persons for up to 2 hours after direct contact.  Also, these can be transmitted when someone coughs or sneezes; thus, it is important to cover the mouth to reduce this risk.  To keep the spread of the illness at bay, good hand hygiene is very important.  This is an infection that is most likely caused by a virus. There are no specific treatments other than to help you with the symptoms until the infection runs its course.  We are sorry you are not feeling well.  Here is how we plan to help!   For nasal congestion, you may use an oral decongestants such as Mucinex D or if you have glaucoma or high blood pressure use plain Mucinex.  Saline nasal spray or nasal drops can help and can safely be used as often as  needed for congestion.  For your congestion, I have prescribed Ipratropium Bromide nasal spray 0.03% two sprays in each nostril 2-3 times a day  If you do not have a history of heart disease, hypertension, diabetes or thyroid disease, prostate/bladder issues or glaucoma, you may also use Sudafed to treat nasal congestion.  It is highly recommended that you consult with a pharmacist or your primary care physician to ensure this medication is safe for you to take.     If you have a cough, you may use cough suppressants such as Delsym and Robitussin.  If you have glaucoma or high blood pressure, you can also use Coricidin HBP.   For cough I have prescribed for you A prescription cough medication called Tessalon Perles 100 mg. You may take 1-2 capsules every 8 hours as needed for cough  If you have a sore or scratchy throat, use a saltwater gargle-  to  teaspoon of salt dissolved in a 4-ounce to 8-ounce glass of warm water.  Gargle the solution for approximately 15-30 seconds and then spit.  It is important not to swallow the solution.  You can also use throat lozenges/cough drops and Chloraseptic spray to help with throat pain or discomfort.  Warm or cold liquids can also be helpful in relieving throat pain.  For headache, pain or general discomfort, you can use Ibuprofen or Tylenol as directed.     Some authorities believe that zinc sprays or the use of Echinacea may shorten the course of your symptoms.   HOME CARE Only take medications as instructed by your medical team. Be sure to drink plenty of fluids. Water is fine as well as fruit juices, sodas and electrolyte beverages. You may want to stay away from caffeine or alcohol. If you are nauseated, try taking small sips of liquids. How do you know if you are getting enough fluid? Your urine should be a pale yellow or almost colorless. Get rest. Taking a steamy shower or using a humidifier may help nasal congestion and ease sore throat pain. You can  place a towel over your head and breathe in the steam from hot water coming from a faucet. Using a saline nasal spray works much the same way. Cough drops, hard candies and sore throat lozenges may ease your cough. Avoid close contacts especially the very young and the elderly Cover your mouth if you cough or sneeze Always remember to wash your hands.   GET HELP RIGHT AWAY IF: You develop worsening fever. If your symptoms do not improve within 10 days You develop yellow or green discharge from your nose over 3 days. You have coughing fits You develop a severe head ache or visual changes. You develop shortness of breath, difficulty breathing or start having chest pain Your symptoms persist after you have completed your treatment plan  MAKE SURE YOU  Understand these instructions. Will watch your condition. Will get help right away if you are not doing well or get worse.  Thank you for choosing an e-visit.  Your e-visit answers were reviewed by a board certified advanced clinical practitioner to complete your personal care plan. Depending upon the condition, your plan could have included both over the counter or prescription medications.  Please review your pharmacy choice. Make sure the pharmacy is open so you can pick up prescription now. If there is a problem, you may contact your provider through MyChart messaging and have the prescription routed to another pharmacy.  Your safety is important to us. If you have drug allergies check your prescription carefully.   For the next 24 hours you can use MyChart to ask questions about today's visit, request a non-urgent call back, or ask for a work or school excuse. You will get an email in the next two days asking about your experience. I hope that your e-visit has been valuable and will speed your recovery.  I have spent 5 minutes in review of e-visit questionnaire, review and updating patient chart, medical decision making and response to  patient.   Jennifer M Burnette, PA-C  

## 2023-01-03 ENCOUNTER — Other Ambulatory Visit (HOSPITAL_BASED_OUTPATIENT_CLINIC_OR_DEPARTMENT_OTHER): Payer: Self-pay

## 2023-01-06 ENCOUNTER — Other Ambulatory Visit (HOSPITAL_BASED_OUTPATIENT_CLINIC_OR_DEPARTMENT_OTHER): Payer: Self-pay

## 2023-01-10 ENCOUNTER — Other Ambulatory Visit (HOSPITAL_BASED_OUTPATIENT_CLINIC_OR_DEPARTMENT_OTHER): Payer: Self-pay

## 2023-01-11 ENCOUNTER — Encounter (HOSPITAL_BASED_OUTPATIENT_CLINIC_OR_DEPARTMENT_OTHER): Payer: Self-pay | Admitting: Family Medicine

## 2023-01-13 ENCOUNTER — Other Ambulatory Visit (HOSPITAL_BASED_OUTPATIENT_CLINIC_OR_DEPARTMENT_OTHER): Payer: Self-pay

## 2023-01-24 DIAGNOSIS — L821 Other seborrheic keratosis: Secondary | ICD-10-CM | POA: Diagnosis not present

## 2023-01-24 DIAGNOSIS — L218 Other seborrheic dermatitis: Secondary | ICD-10-CM | POA: Diagnosis not present

## 2023-01-24 DIAGNOSIS — L718 Other rosacea: Secondary | ICD-10-CM | POA: Diagnosis not present

## 2023-01-24 DIAGNOSIS — L819 Disorder of pigmentation, unspecified: Secondary | ICD-10-CM | POA: Diagnosis not present

## 2023-02-06 NOTE — Telephone Encounter (Signed)
Called pt and let him know appt was needed. Pt stated it seems like he is just making appointments to talk and he doesn't need to talk that much. Let him know if he changes his mind to call us back to discuss this.

## 2023-02-20 ENCOUNTER — Encounter (HOSPITAL_BASED_OUTPATIENT_CLINIC_OR_DEPARTMENT_OTHER): Payer: Self-pay | Admitting: Family Medicine

## 2023-02-23 ENCOUNTER — Other Ambulatory Visit (HOSPITAL_BASED_OUTPATIENT_CLINIC_OR_DEPARTMENT_OTHER): Payer: Self-pay | Admitting: Family Medicine

## 2023-02-23 DIAGNOSIS — E66811 Obesity, class 1: Secondary | ICD-10-CM

## 2023-02-23 DIAGNOSIS — R7303 Prediabetes: Secondary | ICD-10-CM

## 2023-02-23 MED ORDER — SEMAGLUTIDE-WEIGHT MANAGEMENT 0.25 MG/0.5ML ~~LOC~~ SOAJ
0.2500 mg | SUBCUTANEOUS | 1 refills | Status: DC
Start: 2023-02-23 — End: 2023-03-14

## 2023-03-14 ENCOUNTER — Other Ambulatory Visit (HOSPITAL_BASED_OUTPATIENT_CLINIC_OR_DEPARTMENT_OTHER): Payer: Self-pay

## 2023-03-14 ENCOUNTER — Other Ambulatory Visit (HOSPITAL_BASED_OUTPATIENT_CLINIC_OR_DEPARTMENT_OTHER): Payer: Self-pay | Admitting: *Deleted

## 2023-03-14 DIAGNOSIS — E66811 Obesity, class 1: Secondary | ICD-10-CM

## 2023-03-14 DIAGNOSIS — R7303 Prediabetes: Secondary | ICD-10-CM

## 2023-03-14 MED ORDER — SEMAGLUTIDE-WEIGHT MANAGEMENT 0.25 MG/0.5ML ~~LOC~~ SOAJ
0.2500 mg | SUBCUTANEOUS | 1 refills | Status: DC
Start: 2023-03-14 — End: 2023-05-19
  Filled 2023-03-14: qty 2, 28d supply, fill #0
  Filled 2023-04-12: qty 2, 28d supply, fill #1

## 2023-05-15 ENCOUNTER — Other Ambulatory Visit (HOSPITAL_BASED_OUTPATIENT_CLINIC_OR_DEPARTMENT_OTHER): Payer: Self-pay | Admitting: Family Medicine

## 2023-05-15 DIAGNOSIS — R7303 Prediabetes: Secondary | ICD-10-CM

## 2023-05-15 DIAGNOSIS — E66811 Obesity, class 1: Secondary | ICD-10-CM

## 2023-05-18 ENCOUNTER — Encounter (HOSPITAL_BASED_OUTPATIENT_CLINIC_OR_DEPARTMENT_OTHER): Payer: Self-pay | Admitting: Family Medicine

## 2023-05-18 NOTE — Telephone Encounter (Signed)
 Called pt he is scheduled for 1/10 for follow up. He is agreeable to going to 0.5 if you can send this today as he is out of the medication. Let him know I would forward the message over and see if you would send in before upcoming appointment

## 2023-05-19 ENCOUNTER — Other Ambulatory Visit (HOSPITAL_BASED_OUTPATIENT_CLINIC_OR_DEPARTMENT_OTHER): Payer: Self-pay

## 2023-05-19 MED ORDER — SEMAGLUTIDE-WEIGHT MANAGEMENT 0.25 MG/0.5ML ~~LOC~~ SOAJ
0.5000 mg | SUBCUTANEOUS | 1 refills | Status: DC
Start: 2023-05-19 — End: 2023-05-24
  Filled 2023-05-19 – 2023-05-23 (×2): qty 2, 14d supply, fill #0

## 2023-05-23 ENCOUNTER — Other Ambulatory Visit (HOSPITAL_BASED_OUTPATIENT_CLINIC_OR_DEPARTMENT_OTHER): Payer: Self-pay

## 2023-05-24 ENCOUNTER — Other Ambulatory Visit (HOSPITAL_BASED_OUTPATIENT_CLINIC_OR_DEPARTMENT_OTHER): Payer: Self-pay

## 2023-05-24 MED ORDER — SEMAGLUTIDE-WEIGHT MANAGEMENT 0.5 MG/0.5ML ~~LOC~~ SOAJ
0.5000 mg | SUBCUTANEOUS | 1 refills | Status: DC
  Filled 2023-05-24: qty 2, 28d supply, fill #0
  Filled 2023-07-02: qty 2, 28d supply, fill #1

## 2023-05-26 ENCOUNTER — Ambulatory Visit (HOSPITAL_BASED_OUTPATIENT_CLINIC_OR_DEPARTMENT_OTHER): Payer: BC Managed Care – PPO | Admitting: Family Medicine

## 2023-05-26 ENCOUNTER — Encounter (HOSPITAL_BASED_OUTPATIENT_CLINIC_OR_DEPARTMENT_OTHER): Payer: Self-pay | Admitting: Family Medicine

## 2023-05-26 VITALS — BP 130/102 | HR 65 | Ht 72.0 in | Wt 258.0 lb

## 2023-05-26 DIAGNOSIS — E66811 Obesity, class 1: Secondary | ICD-10-CM | POA: Diagnosis not present

## 2023-05-26 DIAGNOSIS — R7303 Prediabetes: Secondary | ICD-10-CM | POA: Diagnosis not present

## 2023-05-26 DIAGNOSIS — Z0000 Encounter for general adult medical examination without abnormal findings: Secondary | ICD-10-CM

## 2023-05-26 NOTE — Progress Notes (Signed)
    Procedures performed today:    None.  Independent interpretation of notes and tests performed by another provider:   None.  Brief History, Exam, Impression, and Recommendations:    BP (!) 130/102 (BP Location: Left Arm, Patient Position: Sitting, Cuff Size: Large)   Pulse 65   Ht 6' (1.829 m)   Wt 258 lb (117 kg)   SpO2 100%   BMI 34.99 kg/m   Obesity, Class I, BMI 30-34.9 Assessment & Plan: Management of Wegovy  as below. No weight change as of yet, still utilizing low dose of medication.   Prediabetes Assessment & Plan: Patient continues with Wegovy  in regards to blood sugar control as well as assisting with weight loss efforts.  He was utilizing Ozempic  previously but this was no longer covered by insurance. He has been using 0.25 mg dose of Wegovy  without issue. Denies any concerns related to nausea, vomiting.  No weight loss since starting Wegovy . At this time, we will continue with Wegovy , but will titrate dose to 0.5 mg. Plan to proceed with titration if tolerating 0.5 mg dose and after administering for at least 1 month.   Wellness examination -     CBC with Differential/Platelet; Future -     Comprehensive metabolic panel; Future -     Hemoglobin A1c; Future -     Lipid panel; Future -     TSH Rfx on Abnormal to Free T4; Future  Return in about 2 months (around 07/24/2023) for CPE with fasting labs 1 week prior.   ___________________________________________ Irena Gaydos de [place], MD, ABFM, CAQSM Primary Care and Sports Medicine [facility]

## 2023-05-26 NOTE — Patient Instructions (Signed)
  Medication Instructions:  Your physician recommends that you continue on your current medications as directed. Please refer to the Current Medication list given to you today. --If you need a refill on any your medications before your next appointment, please call your pharmacy first. If no refills are authorized on file call the office.-- Lab Work: Your physician has recommended that you have lab work today: 1 week before next visit  If you have labs (blood work) drawn today and your tests are completely normal, you will receive your results via MyChart message OR a phone call from our staff.  Please ensure you check your voicemail in the event that you authorized detailed messages to be left on a delegated number. If you have any lab test that is abnormal or we need to change your treatment, we will call you to review the results.   Follow-Up: Your next appointment:   Your physician recommends that you schedule a follow-up appointment in: 2-3 month physical  with Dr. de Peru  You will receive a text message or e-mail with a link to a survey about your care and experience with us  today! We would greatly appreciate your feedback!   Thanks for letting us  be apart of your health journey!!  Primary Care and Sports Medicine   Dr. Court Distance Peru   We encourage you to activate your patient portal called "MyChart".  Sign up information is provided on this After Visit Summary.  MyChart is used to connect with patients for Virtual Visits (Telemedicine).  Patients are able to view lab/test results, encounter notes, upcoming appointments, etc.  Non-urgent messages can be sent to your provider as well. To learn more about what you can do with MyChart, please visit --  [email or web address].

## 2023-05-30 NOTE — Assessment & Plan Note (Signed)
 Patient continues with Wegovy  in regards to blood sugar control as well as assisting with weight loss efforts.  He was utilizing Ozempic  previously but this was no longer covered by insurance. He has been using 0.25 mg dose of Wegovy  without issue. Denies any concerns related to nausea, vomiting.  No weight loss since starting Wegovy . At this time, we will continue with Wegovy , but will titrate dose to 0.5 mg. Plan to proceed with titration if tolerating 0.5 mg dose and after administering for at least 1 month.

## 2023-05-30 NOTE — Assessment & Plan Note (Addendum)
 Management of Wegovy as below. No weight change as of yet, still utilizing low dose of medication.

## 2023-07-24 ENCOUNTER — Other Ambulatory Visit (HOSPITAL_BASED_OUTPATIENT_CLINIC_OR_DEPARTMENT_OTHER): Payer: Self-pay | Admitting: Family Medicine

## 2023-07-24 ENCOUNTER — Other Ambulatory Visit (HOSPITAL_BASED_OUTPATIENT_CLINIC_OR_DEPARTMENT_OTHER): Payer: BC Managed Care – PPO

## 2023-07-24 DIAGNOSIS — Z0000 Encounter for general adult medical examination without abnormal findings: Secondary | ICD-10-CM | POA: Diagnosis not present

## 2023-07-25 ENCOUNTER — Encounter (HOSPITAL_BASED_OUTPATIENT_CLINIC_OR_DEPARTMENT_OTHER): Payer: Self-pay | Admitting: Family Medicine

## 2023-07-25 LAB — CBC WITH DIFFERENTIAL/PLATELET
Basophils Absolute: 0 10*3/uL (ref 0.0–0.2)
Basos: 1 %
EOS (ABSOLUTE): 0.1 10*3/uL (ref 0.0–0.4)
Eos: 2 %
Hematocrit: 49.4 % (ref 37.5–51.0)
Hemoglobin: 16.4 g/dL (ref 13.0–17.7)
Immature Grans (Abs): 0 10*3/uL (ref 0.0–0.1)
Immature Granulocytes: 0 %
Lymphocytes Absolute: 3 10*3/uL (ref 0.7–3.1)
Lymphs: 40 %
MCH: 30.3 pg (ref 26.6–33.0)
MCHC: 33.2 g/dL (ref 31.5–35.7)
MCV: 91 fL (ref 79–97)
Monocytes Absolute: 0.5 10*3/uL (ref 0.1–0.9)
Monocytes: 6 %
Neutrophils Absolute: 4 10*3/uL (ref 1.4–7.0)
Neutrophils: 51 %
Platelets: 288 10*3/uL (ref 150–450)
RBC: 5.42 x10E6/uL (ref 4.14–5.80)
RDW: 13 % (ref 11.6–15.4)
WBC: 7.7 10*3/uL (ref 3.4–10.8)

## 2023-07-25 LAB — COMPREHENSIVE METABOLIC PANEL
ALT: 38 IU/L (ref 0–44)
AST: 23 IU/L (ref 0–40)
Albumin: 4.5 g/dL (ref 3.8–4.9)
Alkaline Phosphatase: 92 IU/L (ref 44–121)
BUN/Creatinine Ratio: 12 (ref 9–20)
BUN: 14 mg/dL (ref 6–24)
Bilirubin Total: 0.4 mg/dL (ref 0.0–1.2)
CO2: 24 mmol/L (ref 20–29)
Calcium: 9.3 mg/dL (ref 8.7–10.2)
Chloride: 103 mmol/L (ref 96–106)
Creatinine, Ser: 1.19 mg/dL (ref 0.76–1.27)
Globulin, Total: 2.5 g/dL (ref 1.5–4.5)
Glucose: 89 mg/dL (ref 70–99)
Potassium: 4.4 mmol/L (ref 3.5–5.2)
Sodium: 138 mmol/L (ref 134–144)
Total Protein: 7 g/dL (ref 6.0–8.5)
eGFR: 72 mL/min/{1.73_m2} (ref >59)

## 2023-07-25 LAB — HEMOGLOBIN A1C
Est. average glucose Bld gHb Est-mCnc: 117 mg/dL
Hgb A1c MFr Bld: 5.7 % — ABNORMAL HIGH (ref 4.8–5.6)

## 2023-07-25 LAB — LIPID PANEL
Chol/HDL Ratio: 4.6 ratio (ref 0.0–5.0)
Cholesterol, Total: 185 mg/dL (ref 100–199)
HDL: 40 mg/dL (ref >39)
LDL Chol Calc (NIH): 130 mg/dL — ABNORMAL HIGH (ref 0–99)
Triglycerides: 80 mg/dL (ref 0–149)
VLDL Cholesterol Cal: 15 mg/dL (ref 5–40)

## 2023-07-25 LAB — TSH RFX ON ABNORMAL TO FREE T4: TSH: 2.15 u[IU]/mL (ref 0.450–4.500)

## 2023-07-31 ENCOUNTER — Encounter (HOSPITAL_BASED_OUTPATIENT_CLINIC_OR_DEPARTMENT_OTHER): Payer: Self-pay

## 2023-07-31 ENCOUNTER — Encounter (HOSPITAL_BASED_OUTPATIENT_CLINIC_OR_DEPARTMENT_OTHER): Payer: Self-pay | Admitting: Family Medicine

## 2023-07-31 ENCOUNTER — Ambulatory Visit (INDEPENDENT_AMBULATORY_CARE_PROVIDER_SITE_OTHER): Payer: BC Managed Care – PPO | Admitting: Family Medicine

## 2023-07-31 ENCOUNTER — Other Ambulatory Visit (HOSPITAL_BASED_OUTPATIENT_CLINIC_OR_DEPARTMENT_OTHER): Payer: Self-pay

## 2023-07-31 VITALS — BP 124/86 | HR 78 | Ht 72.0 in | Wt 259.3 lb

## 2023-07-31 DIAGNOSIS — R7303 Prediabetes: Secondary | ICD-10-CM | POA: Diagnosis not present

## 2023-07-31 DIAGNOSIS — E66811 Obesity, class 1: Secondary | ICD-10-CM

## 2023-07-31 DIAGNOSIS — Z0000 Encounter for general adult medical examination without abnormal findings: Secondary | ICD-10-CM

## 2023-07-31 MED ORDER — SEMAGLUTIDE-WEIGHT MANAGEMENT 1 MG/0.5ML ~~LOC~~ SOAJ
1.0000 mg | SUBCUTANEOUS | 1 refills | Status: DC
  Filled 2023-07-31: qty 2, 28d supply, fill #0

## 2023-07-31 NOTE — Patient Instructions (Signed)
  Medication Instructions:  Your physician recommends that you continue on your current medications as directed. Please refer to the Current Medication list given to you today. --If you need a refill on any your medications before your next appointment, please   Follow-Up: Your next appointment:   Your physician recommends that you schedule a follow-up appointment in: 6-8 week follow up  with Dr. de Peru  You will receive a text message or e-mail with a link to a survey about your care and experience with [place] today! We would greatly appreciate your feedback!   Thanks for letting us be apart of your health journey!!  Primary Care and Sports Medicine   Dr. Ceasar Mons Peru   We encourage you to activate your patient portal called "MyChart".  Sign up information is provided on this After Visit Summary.  MyChart is used to connect with patients for Virtual Visits (Telemedicine).  Patients are able to view lab/test results, encounter notes, upcoming appointments, etc.  Non-urgent messages can be sent to your provider as well. To learn more about what you can do with MyChart, please visit --  [email or web address].

## 2023-07-31 NOTE — Progress Notes (Signed)
 Subjective:    CC: Annual Physical Exam  HPI:  Eugene Gould is a 58 y.o. presenting for annual physical  I reviewed the past medical history, family history, social history, surgical history, and allergies today and no changes were needed.  Please see the problem list section below in epic for further details.  Past Medical History: Past Medical History:  Diagnosis Date   Asthma    Past Surgical History: Past Surgical History:  Procedure Laterality Date   COLONOSCOPY  10/2020   TONSILLECTOMY     Social History: Social History   Socioeconomic History   Marital status: Married    Spouse name: Not on file   Number of children: Not on file   Years of education: Not on file   Highest education level: Bachelor's degree (e.g., BA, AB, BS)  Occupational History    Employer: [employer]  Tobacco Use   Smoking status: Never    Passive exposure: Never   Smokeless tobacco: Never  Vaping Use   Vaping status: Never Used  Substance and Sexual Activity   Alcohol use: Not Currently   Drug use: Never   Sexual activity: Yes  Other Topics Concern   Not on file  Social History Narrative   Not on file   Social Drivers of Health   Financial Resource Strain: Low Risk  (05/23/2023)   Overall Financial Resource Strain (CARDIA)    Difficulty of Paying Living Expenses: Not hard at all  Food Insecurity: No Food Insecurity (05/23/2023)   Hunger Vital Sign    Worried About Running Out of Food in the Last Year: Never true    Ran Out of Food in the Last Year: Never true  Transportation Needs: No Transportation Needs (05/23/2023)   PRAPARE - Administrator, Civil Service (Medical): No    Lack of Transportation (Non-Medical): No  Physical Activity: Insufficiently Active (05/23/2023)   Exercise Vital Sign    Days of Exercise per Week: 4 days    Minutes of Exercise per Session: 30 min  Stress: No Stress Concern Present (05/23/2023)   [employer] of Occupational Health -  Occupational Stress Questionnaire    Feeling of Stress : Only a little  Social Connections: Socially Integrated (05/23/2023)   Social Connection and Isolation Panel [NHANES]    Frequency of Communication with Friends and Family: More than three times a week    Frequency of Social Gatherings with Friends and Family: Twice a week    Attends Religious Services: More than 4 times per year    Active Member of [employer] or Organizations: Yes    Attends Engineer, structural: 1 to 4 times per year    Marital Status: Married   Family History: Family History  Problem Relation Age of Onset   Anuerysm Mother    Heart disease Father    Hypertension Father    Non-Hodgkin's lymphoma Father    Gout Brother    Alzheimer's disease Maternal Grandmother    Cancer Paternal Grandmother    COPD Paternal Grandfather    Colon cancer Neg Hx    Colon polyps Neg Hx    Esophageal cancer Neg Hx    Rectal cancer Neg Hx    Stomach cancer Neg Hx    Allergies: Allergies  Allergen Reactions   Codeine Other (See Comments)    Shakes    Medications: See med rec.  Review of Systems: No headache, visual changes, nausea, vomiting, diarrhea, constipation, dizziness, abdominal pain, skin rash,  fevers, chills, night sweats, swollen lymph nodes, weight loss, chest pain, body aches, joint swelling, muscle aches, shortness of breath, mood changes, visual or auditory hallucinations.  Objective:    BP 124/86 (BP Location: Right Arm, Patient Position: Sitting, Cuff Size: Normal)   Pulse 78   Ht 6' (1.829 m)   Wt 259 lb 4.8 oz (117.6 kg)   PF 97 L/min   BMI 35.17 kg/m   General: Well Developed, well nourished, and in no acute distress. Neuro: Alert and oriented x3, extra-ocular muscles intact, sensation grossly intact. Cranial nerves II through XII are intact, motor, sensory, and coordinative functions are all intact. HEENT: Normocephalic, atraumatic, pupils equal round reactive to light, neck supple, no masses,  no lymphadenopathy, thyroid nonpalpable. Oropharynx, nasopharynx, external ear canals are unremarkable. Skin: Warm and dry, no rashes noted. Cardiac: Regular rate and rhythm, no murmurs rubs or gallops. Respiratory: Clear to auscultation bilaterally. Not using accessory muscles, speaking in full sentences. Abdominal: Soft, nontender, nondistended, positive bowel sounds, no masses, no organomegaly. Musculoskeletal: Shoulder, elbow, wrist, hip, knee, ankle stable, and with full range of motion.  Impression and Recommendations:    Wellness examination Assessment & Plan: Routine HCM labs reviewed. HCM reviewed/discussed. Anticipatory guidance regarding healthy weight, lifestyle and choices given. Recommend healthy diet.  Recommend approximately 150 minutes/week of moderate intensity exercise Recommend regular dental and vision exams Always use seatbelt/lap and shoulder restraints Recommend using smoke alarms and checking batteries at least twice a year Recommend using sunscreen when outside Discussed colon cancer screening recommendations, options - patient UTD with colonoscopy, next due 2027 Discussed recommendations for shingles vaccine - patient has received previously Discussed tetanus immunization recommendations, patient UTD   Obesity, Class I, BMI 30-34.9 -     Semaglutide-Weight Management; Inject 1 mg into the skin once a week.  Dispense: 2 mL; Refill: 1  Prediabetes Assessment & Plan: Patient continues with Wegovy in regards to blood sugar control as well as assisting with weight loss efforts.  He was utilizing Ozempic previously but this was no longer covered by insurance. He has been using 0.5 mg dose of Wegovy without issue. Denies any concerns related to nausea, vomiting.  No significant weight loss since starting Wegovy.  Some frustration with this, however did discuss that weight loss may not be as significant at lower dosages of medication At this time, we will continue with  [facility], but will titrate dose to 1 mg. Plan to proceed with titration if tolerating 0.5 mg dose and after administering for at least 1 month. Advised that he can send [place] message after about 1 month of administering medication to let us know if he is having any issues or if he would like to proceed with titration to 1.7 mg dose  Orders: -     Semaglutide-Weight Management; Inject 1 mg into the skin once a week.  Dispense: 2 mL; Refill: 1  Return in about 2 months (around 09/30/2023) for med check, prediabetes.   ___________________________________________ Cohen Doleman de [place], MD, ABFM, CAQSM Primary Care and Sports Medicine [facility]

## 2023-07-31 NOTE — Assessment & Plan Note (Signed)
 Routine HCM labs reviewed. HCM reviewed/discussed. Anticipatory guidance regarding healthy weight, lifestyle and choices given. Recommend healthy diet.  Recommend approximately 150 minutes/week of moderate intensity exercise Recommend regular dental and vision exams Always use seatbelt/lap and shoulder restraints Recommend using smoke alarms and checking batteries at least twice a year Recommend using sunscreen when outside Discussed colon cancer screening recommendations, options - patient UTD with colonoscopy, next due 2027 Discussed recommendations for shingles vaccine - patient has received previously Discussed tetanus immunization recommendations, patient UTD

## 2023-07-31 NOTE — Assessment & Plan Note (Signed)
 Patient continues with Wegovy in regards to blood sugar control as well as assisting with weight loss efforts.  He was utilizing Ozempic previously but this was no longer covered by insurance. He has been using 0.5 mg dose of Wegovy without issue. Denies any concerns related to nausea, vomiting.  No significant weight loss since starting Wegovy.  Some frustration with this, however did discuss that weight loss may not be as significant at lower dosages of medication At this time, we will continue with [facility], but will titrate dose to 1 mg. Plan to proceed with titration if tolerating 0.5 mg dose and after administering for at least 1 month. Advised that he can send [place] message after about 1 month of administering medication to let us know if he is having any issues or if he would like to proceed with titration to 1.7 mg dose

## 2023-08-02 ENCOUNTER — Telehealth (HOSPITAL_BASED_OUTPATIENT_CLINIC_OR_DEPARTMENT_OTHER): Payer: Self-pay | Admitting: *Deleted

## 2023-08-02 NOTE — Telephone Encounter (Signed)
 Received prior authorization for wegovy 1mg    Submitted to cover my meds

## 2023-08-04 ENCOUNTER — Other Ambulatory Visit (HOSPITAL_BASED_OUTPATIENT_CLINIC_OR_DEPARTMENT_OTHER): Payer: Self-pay

## 2023-08-07 ENCOUNTER — Other Ambulatory Visit (HOSPITAL_BASED_OUTPATIENT_CLINIC_OR_DEPARTMENT_OTHER): Payer: Self-pay

## 2023-08-07 ENCOUNTER — Encounter (HOSPITAL_BASED_OUTPATIENT_CLINIC_OR_DEPARTMENT_OTHER): Payer: Self-pay | Admitting: Family Medicine

## 2023-08-15 ENCOUNTER — Telehealth (HOSPITAL_BASED_OUTPATIENT_CLINIC_OR_DEPARTMENT_OTHER): Payer: Self-pay | Admitting: *Deleted

## 2023-08-15 NOTE — Telephone Encounter (Signed)
 Called pt to let him know that wegovy had been denied but we were initiating a prior authorization on 1 mg wegovy. He needs to come by the office and sign a release form for [place] to submit this to his insurance company.

## 2023-08-16 ENCOUNTER — Other Ambulatory Visit (HOSPITAL_BASED_OUTPATIENT_CLINIC_OR_DEPARTMENT_OTHER): Payer: Self-pay

## 2023-08-16 NOTE — Telephone Encounter (Signed)
 Eugene Gould, [phone]  [name] called today and I read the note in the system that was for him yesterday. He advised that he had already gone through this prior to needing signed again. He stated that this was the issue 2 weeks ago and doesn't understand why he needs this again. Please recall patient to explain.

## 2023-08-16 NOTE — Telephone Encounter (Signed)
 Spoke with patient   Patient upset it has been 2 weeks advised we initiated prior auth and had to wait for them to deny this, then we had to start appeal and wait for them to send paperwork.  Patient will come by on Monday to sign form. (Eugene Gould's Box)

## 2023-08-18 ENCOUNTER — Other Ambulatory Visit (HOSPITAL_BASED_OUTPATIENT_CLINIC_OR_DEPARTMENT_OTHER): Payer: Self-pay

## 2023-08-22 ENCOUNTER — Telehealth (HOSPITAL_BASED_OUTPATIENT_CLINIC_OR_DEPARTMENT_OTHER): Payer: Self-pay | Admitting: Family Medicine

## 2023-08-22 NOTE — Telephone Encounter (Signed)
 Spoke with Patient appeal form signed and will send over to insurance for wegovy 1mg 

## 2023-08-22 NOTE — Telephone Encounter (Signed)
 Pt states he has paperwork he needs to sign per cma on 08/16/23. Patient stopped by in office

## 2023-08-31 DIAGNOSIS — L814 Other melanin hyperpigmentation: Secondary | ICD-10-CM | POA: Diagnosis not present

## 2023-08-31 DIAGNOSIS — L821 Other seborrheic keratosis: Secondary | ICD-10-CM | POA: Diagnosis not present

## 2023-08-31 DIAGNOSIS — R0982 Postnasal drip: Secondary | ICD-10-CM | POA: Diagnosis not present

## 2023-08-31 DIAGNOSIS — R051 Acute cough: Secondary | ICD-10-CM | POA: Diagnosis not present

## 2023-08-31 DIAGNOSIS — L570 Actinic keratosis: Secondary | ICD-10-CM | POA: Diagnosis not present

## 2023-08-31 DIAGNOSIS — D225 Melanocytic nevi of trunk: Secondary | ICD-10-CM | POA: Diagnosis not present

## 2023-08-31 DIAGNOSIS — D492 Neoplasm of unspecified behavior of bone, soft tissue, and skin: Secondary | ICD-10-CM | POA: Diagnosis not present

## 2023-08-31 DIAGNOSIS — R0981 Nasal congestion: Secondary | ICD-10-CM | POA: Diagnosis not present

## 2023-08-31 DIAGNOSIS — L923 Foreign body granuloma of the skin and subcutaneous tissue: Secondary | ICD-10-CM | POA: Diagnosis not present

## 2023-08-31 DIAGNOSIS — J060 Acute laryngopharyngitis: Secondary | ICD-10-CM | POA: Diagnosis not present

## 2023-09-01 ENCOUNTER — Other Ambulatory Visit (HOSPITAL_BASED_OUTPATIENT_CLINIC_OR_DEPARTMENT_OTHER): Payer: Self-pay

## 2023-09-08 NOTE — Telephone Encounter (Signed)
 Mychart message sent by pt asking if we have any update for him about the Wegovy .  Eugene Gould, please advise if a prior [name] has been initiated on pt and if so, do you have an update on this for pt? I tried searching cover my meds for him and I cannot find any information on him.

## 2023-09-11 ENCOUNTER — Telehealth (HOSPITAL_BASED_OUTPATIENT_CLINIC_OR_DEPARTMENT_OTHER): Payer: Self-pay | Admitting: *Deleted

## 2023-09-11 NOTE — Telephone Encounter (Signed)
 Called insurance. They did not receive the signed authorization form from patient in regards to appeal on wegovy . This was in chart so faxed back over to [employer]. Called to see if [employer] had received this document and they advised that this could take up to 24 hrs to reflect on their end. Was given appeal number [phone]. Called patient to let him know that I have been on the phone with insurance this morning and the plan going forward would be to call the insurance in the am to check on this. If for some reason since we have been trying to appeal since March we would speak with provider about alternative medication. Patient was agreeable.

## 2023-09-11 NOTE — Telephone Encounter (Signed)
 Called patient insurance they advised they had sent something back 4/23. Advised we had not received this. They were needing last page with patient signature refaxed over. This has been refaxed and I advised insurance I would check back in the PM on 4/28

## 2023-09-13 ENCOUNTER — Other Ambulatory Visit (HOSPITAL_BASED_OUTPATIENT_CLINIC_OR_DEPARTMENT_OTHER): Payer: Self-pay

## 2023-09-13 ENCOUNTER — Telehealth (HOSPITAL_BASED_OUTPATIENT_CLINIC_OR_DEPARTMENT_OTHER): Payer: Self-pay | Admitting: *Deleted

## 2023-09-13 DIAGNOSIS — R7303 Prediabetes: Secondary | ICD-10-CM

## 2023-09-13 DIAGNOSIS — E66811 Obesity, class 1: Secondary | ICD-10-CM

## 2023-09-13 NOTE — Telephone Encounter (Signed)
 Have resubmitted appeal form for wegovy  1 mg to [employer]. Called to check on this appeal Monday they never received the forms. Called back today they are still saying nothing has been received have faxed multiple times.  Patient is wanting to  know what can be sent in or what is an alternative to injections please advise

## 2023-09-14 NOTE — Telephone Encounter (Signed)
 LVM for pt to call the office.

## 2023-09-14 NOTE — Telephone Encounter (Signed)
 Patient would like to start zepbound  can you please send this to the pharmacy downstairs

## 2023-09-15 ENCOUNTER — Other Ambulatory Visit (HOSPITAL_BASED_OUTPATIENT_CLINIC_OR_DEPARTMENT_OTHER): Payer: Self-pay

## 2023-09-15 MED ORDER — ZEPBOUND 2.5 MG/0.5ML ~~LOC~~ SOAJ
2.5000 mg | SUBCUTANEOUS | 1 refills | Status: DC
Start: 2023-09-15 — End: 2023-11-13
  Filled 2023-09-15: qty 2, 28d supply, fill #0
  Filled 2023-10-19: qty 2, 28d supply, fill #1

## 2023-09-15 NOTE — Addendum Note (Signed)
 Addended by: DE Peru, Joaovictor Krone J on: 09/15/2023 08:13 AM   Modules accepted: Orders

## 2023-09-19 ENCOUNTER — Other Ambulatory Visit (HOSPITAL_COMMUNITY): Payer: Self-pay

## 2023-09-19 ENCOUNTER — Other Ambulatory Visit (HOSPITAL_BASED_OUTPATIENT_CLINIC_OR_DEPARTMENT_OTHER): Payer: Self-pay

## 2023-09-21 ENCOUNTER — Ambulatory Visit (HOSPITAL_BASED_OUTPATIENT_CLINIC_OR_DEPARTMENT_OTHER): Admitting: Family Medicine

## 2023-10-02 DIAGNOSIS — R0981 Nasal congestion: Secondary | ICD-10-CM | POA: Diagnosis not present

## 2023-10-02 DIAGNOSIS — R0982 Postnasal drip: Secondary | ICD-10-CM | POA: Diagnosis not present

## 2023-10-02 DIAGNOSIS — J208 Acute bronchitis due to other specified organisms: Secondary | ICD-10-CM | POA: Diagnosis not present

## 2023-10-02 DIAGNOSIS — R051 Acute cough: Secondary | ICD-10-CM | POA: Diagnosis not present

## 2023-10-05 DIAGNOSIS — R208 Other disturbances of skin sensation: Secondary | ICD-10-CM | POA: Diagnosis not present

## 2023-10-05 DIAGNOSIS — L923 Foreign body granuloma of the skin and subcutaneous tissue: Secondary | ICD-10-CM | POA: Diagnosis not present

## 2023-10-26 ENCOUNTER — Ambulatory Visit (HOSPITAL_BASED_OUTPATIENT_CLINIC_OR_DEPARTMENT_OTHER): Admitting: Family Medicine

## 2023-11-13 ENCOUNTER — Other Ambulatory Visit (HOSPITAL_BASED_OUTPATIENT_CLINIC_OR_DEPARTMENT_OTHER): Payer: Self-pay | Admitting: Family Medicine

## 2023-11-13 ENCOUNTER — Other Ambulatory Visit (HOSPITAL_BASED_OUTPATIENT_CLINIC_OR_DEPARTMENT_OTHER): Payer: Self-pay

## 2023-11-13 ENCOUNTER — Encounter (HOSPITAL_BASED_OUTPATIENT_CLINIC_OR_DEPARTMENT_OTHER): Payer: Self-pay | Admitting: Family Medicine

## 2023-11-13 DIAGNOSIS — E66811 Obesity, class 1: Secondary | ICD-10-CM

## 2023-11-13 DIAGNOSIS — R7303 Prediabetes: Secondary | ICD-10-CM

## 2023-11-13 MED ORDER — ZEPBOUND 5 MG/0.5ML ~~LOC~~ SOAJ
5.0000 mg | SUBCUTANEOUS | 2 refills | Status: DC
  Filled 2023-11-13: qty 2, 28d supply, fill #0

## 2023-11-14 ENCOUNTER — Other Ambulatory Visit (HOSPITAL_BASED_OUTPATIENT_CLINIC_OR_DEPARTMENT_OTHER): Payer: Self-pay

## 2023-11-27 ENCOUNTER — Ambulatory Visit (HOSPITAL_BASED_OUTPATIENT_CLINIC_OR_DEPARTMENT_OTHER): Admitting: Family Medicine

## 2023-11-27 VITALS — BP 145/100 | HR 83 | Ht 72.0 in | Wt 255.2 lb

## 2023-11-27 DIAGNOSIS — K769 Liver disease, unspecified: Secondary | ICD-10-CM | POA: Insufficient documentation

## 2023-11-27 DIAGNOSIS — R7303 Prediabetes: Secondary | ICD-10-CM | POA: Diagnosis not present

## 2023-11-27 DIAGNOSIS — E66811 Obesity, class 1: Secondary | ICD-10-CM | POA: Diagnosis not present

## 2023-11-27 DIAGNOSIS — J45909 Unspecified asthma, uncomplicated: Secondary | ICD-10-CM | POA: Insufficient documentation

## 2023-11-27 NOTE — Patient Instructions (Signed)
  Medication Instructions:  Your physician recommends that you continue on your current medications as directed. Please refer to the Current Medication list given to you today. --If you need a refill on any your medications before your next appointment, please call your pharmacy first. If no refills are authorized on file call the office.--   Follow-Up: Your next appointment:   Your physician recommends that you schedule a follow-up appointment in: 2 month follow up  with Dr. de [place]  You will receive a text message or e-mail with a link to a survey about your care and experience with [place] today! We would greatly appreciate your feedback!   Thanks for letting us be apart of your health journey!!  Primary Care and Sports Medicine   Dr. Ceasar Mons Peru   We encourage you to activate your patient portal called "MyChart".  Sign up information is provided on this After Visit Summary.  MyChart is used to connect with patients for Virtual Visits (Telemedicine).  Patients are able to view lab/test results, encounter notes, upcoming appointments, etc.  Non-urgent messages can be sent to your provider as well. To learn more about what you can do with MyChart, please visit --  [email or web address].

## 2023-11-27 NOTE — Progress Notes (Signed)
    Procedures performed today:    None.  Independent interpretation of notes and tests performed by another provider:   None.  Brief History, Exam, Impression, and Recommendations:    BP (!) 145/100 (BP Location: Right Arm, Patient Position: Sitting, Cuff Size: Normal)   Pulse 83   Ht 6' (1.829 m)   Wt 255 lb 3.2 oz (115.8 kg)   SpO2 93%   BMI 34.61 kg/m   Obesity, Class I, BMI 30-34.9 Assessment & Plan: Patient reports that he has been doing well in regards to Zepbound .  He did have to change medication due to insurance coverage.  He currently has been administering 5 mg dosage.  Has administered 2 doses so far.  Typically administers on Wednesdays.  Denies any significant side effects or issues with medication.  So far, observed weight change has been minimal.  Patient also continues with lifestyle modifications. Starting weight 259 pounds.  Current weight in office today is 255 pounds. We discussed options, can continue with current dose of medication.  We can look to titrate dose after fourth dose.  Will increase to 7.5 mg at time.  Cautioned on potential side effects with increasing dose of medication. Prescription will be sent to pharmacy for Zepbound  7.5 mg, dispense 2 mL, 2 refills. Discussed that if doing well with 7.5 mg dose, we can look to further titrate after 4 doses.  If having any mild side effects, we can also continue a 7.5 mg dose beyond 1 month to allow for further adjustment to medication. We will plan to follow-up in about 2 months or sooner as needed   Prediabetes Assessment & Plan: As above, can continue with GLP-1 receptor agonist, tolerating well at this time.  Will plan to monitor A1c intermittently   Return in about 2 months (around 01/28/2024) for med check.   ___________________________________________ Eugene Borchardt de [place], MD, ABFM, CAQSM Primary Care and Sports Medicine [facility]

## 2023-11-27 NOTE — Assessment & Plan Note (Signed)
 As above, can continue with GLP-1 receptor agonist, tolerating well at this time.  Will plan to monitor A1c intermittently

## 2023-11-27 NOTE — Assessment & Plan Note (Signed)
 Patient reports that he has been doing well in regards to Zepbound .  He did have to change medication due to insurance coverage.  He currently has been administering 5 mg dosage.  Has administered 2 doses so far.  Typically administers on Wednesdays.  Denies any significant side effects or issues with medication.  So far, observed weight change has been minimal.  Patient also continues with lifestyle modifications. Starting weight 259 pounds.  Current weight in office today is 255 pounds. We discussed options, can continue with current dose of medication.  We can look to titrate dose after fourth dose.  Will increase to 7.5 mg at time.  Cautioned on potential side effects with increasing dose of medication. Prescription will be sent to pharmacy for Zepbound  7.5 mg, dispense 2 mL, 2 refills. Discussed that if doing well with 7.5 mg dose, we can look to further titrate after 4 doses.  If having any mild side effects, we can also continue a 7.5 mg dose beyond 1 month to allow for further adjustment to medication. We will plan to follow-up in about 2 months or sooner as needed

## 2023-12-12 ENCOUNTER — Encounter (HOSPITAL_BASED_OUTPATIENT_CLINIC_OR_DEPARTMENT_OTHER): Payer: Self-pay | Admitting: Family Medicine

## 2023-12-13 ENCOUNTER — Other Ambulatory Visit (HOSPITAL_BASED_OUTPATIENT_CLINIC_OR_DEPARTMENT_OTHER): Payer: Self-pay

## 2023-12-13 MED ORDER — ZEPBOUND 7.5 MG/0.5ML ~~LOC~~ SOAJ
7.5000 mg | SUBCUTANEOUS | 1 refills | Status: DC
  Filled 2023-12-13: qty 2, 28d supply, fill #0

## 2023-12-15 ENCOUNTER — Other Ambulatory Visit (HOSPITAL_BASED_OUTPATIENT_CLINIC_OR_DEPARTMENT_OTHER): Payer: Self-pay

## 2023-12-18 ENCOUNTER — Other Ambulatory Visit (HOSPITAL_BASED_OUTPATIENT_CLINIC_OR_DEPARTMENT_OTHER): Payer: Self-pay

## 2024-01-31 ENCOUNTER — Ambulatory Visit (HOSPITAL_BASED_OUTPATIENT_CLINIC_OR_DEPARTMENT_OTHER): Admitting: Family Medicine

## 2024-02-20 ENCOUNTER — Ambulatory Visit (HOSPITAL_BASED_OUTPATIENT_CLINIC_OR_DEPARTMENT_OTHER): Admitting: Family Medicine

## 2024-02-20 ENCOUNTER — Other Ambulatory Visit (HOSPITAL_BASED_OUTPATIENT_CLINIC_OR_DEPARTMENT_OTHER): Payer: Self-pay

## 2024-02-20 VITALS — BP 136/102 | HR 81 | Ht 72.0 in | Wt 266.0 lb

## 2024-02-20 DIAGNOSIS — R7303 Prediabetes: Secondary | ICD-10-CM | POA: Diagnosis not present

## 2024-02-20 DIAGNOSIS — M7989 Other specified soft tissue disorders: Secondary | ICD-10-CM | POA: Insufficient documentation

## 2024-02-20 DIAGNOSIS — E66812 Obesity, class 2: Secondary | ICD-10-CM | POA: Insufficient documentation

## 2024-02-20 MED ORDER — ZEPBOUND 2.5 MG/0.5ML ~~LOC~~ SOAJ: 2.5000 mg | SUBCUTANEOUS | 1 refills | Status: DC

## 2024-02-20 MED ORDER — ZEPBOUND 2.5 MG/0.5ML ~~LOC~~ SOAJ
2.5000 mg | SUBCUTANEOUS | 1 refills | Status: DC
  Filled 2024-02-20 – 2024-03-01 (×2): qty 2, 28d supply, fill #0

## 2024-02-20 NOTE — Assessment & Plan Note (Signed)
 Patient previously has utilized semaglutide  as well as tirzepatide .  Unfortunately, in 1 instance his insurance was no longer covering semaglutide  and so he had switched to tirzepatide .  Subsequently insurance stopped covering all weight loss medication.  He had been seeing improvement with tirzepatide  as he had titrated up to 7.5 mg dose.  He ultimately would want to resume with through his appetite if possible.  Unfortunately, discussed likelihood that insurance would not cover medication.  He would like to go through appeals process to see about getting this medication covered. We can look to restart medication, would start back with 2.5 mg dose.  Prescription has been sent to pharmacy.

## 2024-02-20 NOTE — Progress Notes (Signed)
    Procedures performed today:    None.  Independent interpretation of notes and tests performed by another provider:   None.  Brief History, Exam, Impression, and Recommendations:    BP (!) 136/102 (BP Location: Left Arm, Patient Position: Sitting)   Pulse 81   Ht 6' (1.829 m)   Wt 266 lb (120.7 kg)   SpO2 99%   BMI 36.08 kg/m   Leg swelling Assessment & Plan: Patient was recently playing [place] with his wife when she pointed out a small area of swelling on his leg.  Prior to this, he had been unaware of this area.  He notes that area is over lateral aspect of left lower leg.  Has not had any observed skin changes, pain.  He has noticed increased prominence of swelling at times, such as with activity.  His primary concern is for possible clot given that his father did have 1 in the past.  He does not feel that he has had more notable swelling in 1 leg or the other. On exam, patient is in no acute distress.  Over lateral aspect of left lower leg, there is a small area of increased prominence that is about 1 cm in diameter.  This area is soft, compressible.  No tenderness to palpation.  Mild telangiectasias overlying skin in this region, however no other skin changes noted.  No increased warmth.  Patient does have about 1+ pitting edema bilaterally.  No tenderness to palpation over posterior calf bilaterally. Suspect area swelling is related to superficial vein.  Doubt DVT at this time given that there is some pitting edema present, however this is noted bilaterally and is equal.  He also does not have any tenderness to palpation through posterior calf.  We discussed general considerations.  He would prefer to continue with monitoring for now.  Discussed that if he does begin to develop more noticeable swelling unilaterally, develops associated pain or any other concerning changes, recommend returning to the office for further evaluation.  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  Unfortunately, in 1 instance his insurance was no longer covering semaglutide  and so he had switched to tirzepatide .  Subsequently insurance stopped covering all weight loss medication.  He had been seeing improvement with tirzepatide  as he had titrated up to 7.5 mg dose.  He ultimately would want to resume with through his appetite if possible.  Unfortunately, discussed likelihood that insurance would not cover medication.  He would like to go through appeals process to see about getting this medication covered. We can look to restart medication, would start back with 2.5 mg dose.  Prescription has been sent to pharmacy.  Orders: -     Zepbound ; Inject 2.5 mg into the skin once a week.  Dispense: 2 mL; Refill: 1  Prediabetes -     Zepbound ; Inject 2.5 mg into the skin once a week.  Dispense: 2 mL; Refill: 1  Return if symptoms worsen or fail to improve.   ___________________________________________ Jonathen Rathman de [place], MD, ABFM, CAQSM Primary Care and Sports Medicine [facility]

## 2024-02-20 NOTE — Assessment & Plan Note (Signed)
 Patient was recently playing [place] with his wife when she pointed out a small area of swelling on his leg.  Prior to this, he had been unaware of this area.  He notes that area is over lateral aspect of left lower leg.  Has not had any observed skin changes, pain.  He has noticed increased prominence of swelling at times, such as with activity.  His primary concern is for possible clot given that his father did have 1 in the past.  He does not feel that he has had more notable swelling in 1 leg or the other. On exam, patient is in no acute distress.  Over lateral aspect of left lower leg, there is a small area of increased prominence that is about 1 cm in diameter.  This area is soft, compressible.  No tenderness to palpation.  Mild telangiectasias overlying skin in this region, however no other skin changes noted.  No increased warmth.  Patient does have about 1+ pitting edema bilaterally.  No tenderness to palpation over posterior calf bilaterally. Suspect area swelling is related to superficial vein.  Doubt DVT at this time given that there is some pitting edema present, however this is noted bilaterally and is equal.  He also does not have any tenderness to palpation through posterior calf.  We discussed general considerations.  He would prefer to continue with monitoring for now.  Discussed that if he does begin to develop more noticeable swelling unilaterally, develops associated pain or any other concerning changes, recommend returning to the office for further evaluation.  We also discussed consideration for ultrasound, he would prefer to hold off on this for now

## 2024-02-29 ENCOUNTER — Encounter (HOSPITAL_BASED_OUTPATIENT_CLINIC_OR_DEPARTMENT_OTHER): Payer: Self-pay | Admitting: Family Medicine

## 2024-02-29 DIAGNOSIS — E66812 Obesity, class 2: Secondary | ICD-10-CM

## 2024-02-29 DIAGNOSIS — R030 Elevated blood-pressure reading, without diagnosis of hypertension: Secondary | ICD-10-CM

## 2024-02-29 DIAGNOSIS — R7303 Prediabetes: Secondary | ICD-10-CM

## 2024-03-01 ENCOUNTER — Other Ambulatory Visit (HOSPITAL_BASED_OUTPATIENT_CLINIC_OR_DEPARTMENT_OTHER): Payer: Self-pay

## 2024-03-05 ENCOUNTER — Other Ambulatory Visit (HOSPITAL_BASED_OUTPATIENT_CLINIC_OR_DEPARTMENT_OTHER): Payer: Self-pay

## 2024-03-07 NOTE — Telephone Encounter (Signed)
 Please see most recent mychart messages sent and advise.

## 2024-03-12 NOTE — Telephone Encounter (Signed)
 Please see new mychart messages sent by pt about seeing if another med could be prescribed for weight loss since his insurance will not cover injectables for weight loss measures.  Patient is beginning to get frustrated.

## 2024-03-14 ENCOUNTER — Ambulatory Visit (HOSPITAL_BASED_OUTPATIENT_CLINIC_OR_DEPARTMENT_OTHER): Admitting: Family Medicine

## 2024-03-18 ENCOUNTER — Other Ambulatory Visit (HOSPITAL_BASED_OUTPATIENT_CLINIC_OR_DEPARTMENT_OTHER): Payer: Self-pay

## 2024-03-18 ENCOUNTER — Other Ambulatory Visit (HOSPITAL_COMMUNITY): Payer: Self-pay

## 2024-03-18 MED ORDER — NALTREXONE-BUPROPION HCL ER 8-90 MG PO TB12
ORAL_TABLET | ORAL | 0 refills | Status: AC
  Filled 2024-03-18: qty 120, 40d supply, fill #0

## 2024-03-18 NOTE — Addendum Note (Signed)
 Addended by: DE CUBA, Ellisha Bankson J on: 03/18/2024 04:59 PM   Modules accepted: Orders

## 2024-03-18 NOTE — Telephone Encounter (Signed)
 Pt requesting to have preferred med sent to [facility] pharmacy.

## 2024-03-19 ENCOUNTER — Other Ambulatory Visit (HOSPITAL_COMMUNITY): Payer: Self-pay

## 2024-03-20 ENCOUNTER — Other Ambulatory Visit (HOSPITAL_COMMUNITY): Payer: Self-pay

## 2024-03-20 ENCOUNTER — Encounter (HOSPITAL_BASED_OUTPATIENT_CLINIC_OR_DEPARTMENT_OTHER): Payer: Self-pay

## 2024-03-20 ENCOUNTER — Other Ambulatory Visit (HOSPITAL_BASED_OUTPATIENT_CLINIC_OR_DEPARTMENT_OTHER): Payer: Self-pay

## 2024-03-21 ENCOUNTER — Other Ambulatory Visit (HOSPITAL_BASED_OUTPATIENT_CLINIC_OR_DEPARTMENT_OTHER): Payer: Self-pay

## 2024-03-21 ENCOUNTER — Other Ambulatory Visit (HOSPITAL_COMMUNITY): Payer: Self-pay

## 2024-03-21 ENCOUNTER — Telehealth (HOSPITAL_BASED_OUTPATIENT_CLINIC_OR_DEPARTMENT_OTHER): Payer: Self-pay | Admitting: Pharmacy Technician

## 2024-03-21 NOTE — Telephone Encounter (Signed)
 Pharmacy Patient Advocate Encounter   Received notification from Onbase that prior authorization for Contrave 8-90MG  er tablets is required/requested.   Insurance verification completed.   The patient is insured through [employer].   Per test claim: PA required; PA submitted to above mentioned insurance via Latent Key/confirmation #/[employer] Status is pending

## 2024-03-22 NOTE — Telephone Encounter (Signed)
 Pharmacy Patient Advocate Encounter  Received notification from [facility] that Prior Authorization for Contrave 8-90MG  er tablets has been DENIED.  Full denial letter will be uploaded to the media tab. See denial reason below.   PA #/Case ID/Reference #: [id number]

## 2024-03-25 ENCOUNTER — Other Ambulatory Visit (HOSPITAL_BASED_OUTPATIENT_CLINIC_OR_DEPARTMENT_OTHER): Payer: Self-pay

## 2024-03-26 NOTE — Telephone Encounter (Signed)
 Please see most recent messages sent by pt.

## 2024-03-27 ENCOUNTER — Other Ambulatory Visit (HOSPITAL_BASED_OUTPATIENT_CLINIC_OR_DEPARTMENT_OTHER): Payer: Self-pay

## 2024-03-27 MED ORDER — ZEPBOUND 2.5 MG/0.5ML ~~LOC~~ SOAJ
2.5000 mg | SUBCUTANEOUS | 1 refills | Status: AC
  Filled 2024-03-27: qty 2, 28d supply, fill #0

## 2024-03-27 NOTE — Addendum Note (Signed)
 Addended by: DE CUBA, Obed Samek J on: 03/27/2024 09:19 AM   Modules accepted: Orders

## 2024-04-04 ENCOUNTER — Other Ambulatory Visit (HOSPITAL_BASED_OUTPATIENT_CLINIC_OR_DEPARTMENT_OTHER): Payer: Self-pay

## 2024-04-08 ENCOUNTER — Other Ambulatory Visit (HOSPITAL_BASED_OUTPATIENT_CLINIC_OR_DEPARTMENT_OTHER): Payer: Self-pay

## 2024-04-09 ENCOUNTER — Other Ambulatory Visit (HOSPITAL_BASED_OUTPATIENT_CLINIC_OR_DEPARTMENT_OTHER): Payer: Self-pay

## 2024-04-15 ENCOUNTER — Other Ambulatory Visit (HOSPITAL_BASED_OUTPATIENT_CLINIC_OR_DEPARTMENT_OTHER): Payer: Self-pay
